# Patient Record
Sex: Female | Born: 1995 | Race: Black or African American | Hispanic: No | Marital: Single | Smoking: Never smoker
Health system: Southern US, Community
[De-identification: ages and names within clinical notes are randomized; demographics above are authoritative.]

## PROBLEM LIST (undated history)

## (undated) DIAGNOSIS — Z789 Other specified health status: Secondary | ICD-10-CM

## (undated) DIAGNOSIS — D649 Anemia, unspecified: Secondary | ICD-10-CM

## (undated) DIAGNOSIS — O139 Gestational [pregnancy-induced] hypertension without significant proteinuria, unspecified trimester: Secondary | ICD-10-CM

## (undated) HISTORY — PX: NO PAST SURGERIES: SHX2092

---
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
## 2020-07-23 NOTE — L&D Delivery Note (Addendum)
[facility] Delivery Note  Jenella NEESHA LANGTON is a 25 y.o. G1P0 s/p SVD at [redacted]w[redacted]d. She was admitted for IOL for preeclampsia with severe features.   ROM: 15h 55m with clear fluid GBS Status:  Negative/-- (12/15 1028) Maximum Maternal Temperature:   Labor Progress: Initial SVE: 1/40/-3. She then progressed to complete.   Delivery Date/Time:  Delivery: Called to room and patient was complete and pushing. Head delivered LOA. No nuchal cord present. Shoulder and body delivered in usual fashion. Infant with spontaneous cry, placed on mother's abdomen, dried and stimulated. Cord clamped x 2 due to low fetal tone. Cord blood drawn. Placenta delivered spontaneously with gentle cord traction. Fundus firm with massage and Pitocin. Labia, perineum, vagina, and cervix inspected inspected with no lacerations .  Baby Weight: pending  Placenta: Sent to L&D Complications: None Lacerations: none EBL: 350 mL Analgesia: Epidural   Infant:  APGAR (1 MIN):  6 APGAR (5 MINS):  8 APGAR (10 MINS):     Levie Heritage, DO [facility] 07/18/2021, 5:20 PM

## 2021-04-28 ENCOUNTER — Encounter (HOSPITAL_COMMUNITY): Payer: Self-pay

## 2021-04-28 ENCOUNTER — Inpatient Hospital Stay (HOSPITAL_COMMUNITY)
Admission: EM | Admit: 2021-04-28 | Discharge: 2021-04-28 | Disposition: A | Discharge status: Home / Self Care | Payer: 59 | Attending: Obstetrics and Gynecology | Admitting: Obstetrics and Gynecology

## 2021-04-28 DIAGNOSIS — O368120 Decreased fetal movements, second trimester, not applicable or unspecified: Secondary | ICD-10-CM | POA: Diagnosis present

## 2021-04-28 DIAGNOSIS — Z3A25 25 weeks gestation of pregnancy: Secondary | ICD-10-CM | POA: Diagnosis not present

## 2021-04-28 DIAGNOSIS — O26892 Other specified pregnancy related conditions, second trimester: Secondary | ICD-10-CM | POA: Diagnosis not present

## 2021-04-28 DIAGNOSIS — Z3689 Encounter for other specified antenatal screening: Secondary | ICD-10-CM

## 2021-04-28 DIAGNOSIS — R030 Elevated blood-pressure reading, without diagnosis of hypertension: Secondary | ICD-10-CM | POA: Insufficient documentation

## 2021-04-28 HISTORY — DX: Other specified health status: Z78.9

## 2021-04-28 LAB — CBC WITH DIFFERENTIAL/PLATELET
Abs Immature Granulocytes: 0.48 10*3/uL — ABNORMAL HIGH (ref 0.00–0.07)
Basophils Absolute: 0.1 10*3/uL (ref 0.0–0.1)
Basophils Relative: 0 %
Eosinophils Absolute: 0.2 10*3/uL (ref 0.0–0.5)
Eosinophils Relative: 1 %
HCT: 31.5 % — ABNORMAL LOW (ref 36.0–46.0)
Hemoglobin: 10 g/dL — ABNORMAL LOW (ref 12.0–15.0)
Immature Granulocytes: 3 %
Lymphocytes Relative: 13 %
Lymphs Abs: 2.3 10*3/uL (ref 0.7–4.0)
MCH: 26.9 pg (ref 26.0–34.0)
MCHC: 31.7 g/dL (ref 30.0–36.0)
MCV: 84.7 fL (ref 80.0–100.0)
Monocytes Absolute: 1.2 10*3/uL — ABNORMAL HIGH (ref 0.1–1.0)
Monocytes Relative: 7 %
Neutro Abs: 13.1 10*3/uL — ABNORMAL HIGH (ref 1.7–7.7)
Neutrophils Relative %: 76 %
Platelets: 337 10*3/uL (ref 150–400)
RBC: 3.72 MIL/uL — ABNORMAL LOW (ref 3.87–5.11)
RDW: 13.9 % (ref 11.5–15.5)
WBC: 17.4 10*3/uL — ABNORMAL HIGH (ref 4.0–10.5)
nRBC: 0 % (ref 0.0–0.2)

## 2021-04-28 LAB — COMPREHENSIVE METABOLIC PANEL
ALT: 14 U/L (ref 0–44)
AST: 15 U/L (ref 15–41)
Albumin: 2.7 g/dL — ABNORMAL LOW (ref 3.5–5.0)
Alkaline Phosphatase: 89 U/L (ref 38–126)
Anion gap: 7 (ref 5–15)
BUN: 5 mg/dL — ABNORMAL LOW (ref 6–20)
CO2: 23 mmol/L (ref 22–32)
Calcium: 8.6 mg/dL — ABNORMAL LOW (ref 8.9–10.3)
Chloride: 106 mmol/L (ref 98–111)
Creatinine, Ser: 0.52 mg/dL (ref 0.44–1.00)
GFR, Estimated: >60 mL/min (ref >60)
Glucose, Bld: 84 mg/dL (ref 70–99)
Potassium: 3.9 mmol/L (ref 3.5–5.1)
Sodium: 136 mmol/L (ref 135–145)
Total Bilirubin: 0.4 mg/dL (ref 0.3–1.2)
Total Protein: 6.4 g/dL — ABNORMAL LOW (ref 6.5–8.1)

## 2021-04-28 LAB — PROTEIN / CREATININE RATIO, URINE
Creatinine, Urine: 119.76 mg/dL
Protein Creatinine Ratio: 0.08 mg/mg{Cre} (ref 0.00–0.15)
Total Protein, Urine: 10 mg/dL

## 2021-04-28 LAB — POC URINE PREG, ED: Preg Test, Ur: POSITIVE — AB

## 2021-04-28 NOTE — MAU Note (Signed)
Pt transferred from [facility] with c/o no fetal movement since yesterday. Pt stated she hanot elt baby move since yesterday. Has not had any prenatal visits since she was 10 weeks due to insurance.  No pain, cramping or vag bleeding or discharge reported.

## 2021-04-28 NOTE — Discharge Instructions (Signed)

## 2021-04-28 NOTE — MAU Provider Note (Signed)
History     CSN: [id number]  Arrival date and time: 04/28/21 1310   None     Chief Complaint  Patient presents with   Decreased Fetal Movement   [name] is a 25 y.o. G1P0 at [redacted]w[redacted]d who presents to MAU for decreased fetal movement, but on arrival to MAU, patient states the fetal movement has returned to normal. However, patient was found to incidentally have elevated BP, without symptoms, so preeclampsia labs were drawn. Patient states has not been seen since 9 weeks of pregnancy because she was being seen at [facility] and they were out-of-network and each visit was costing her $500-$600 dollars, which she cannot afford.  Pt denies VB, LOF, ctx, decreased FM, vaginal discharge/odor/itching. Pt denies N/V, abdominal pain, constipation, diarrhea, or urinary problems. Pt denies fever, chills, fatigue, sweating or changes in appetite. Pt denies SOB or chest pain. Pt denies dizziness, HA, light-headedness, weakness.   OB History     Gravida  1   Para      Term      Preterm      AB      Living         SAB      IAB      Ectopic      Multiple      Live Births              Past Medical History:  Diagnosis Date   Medical history non-contributory     Past Surgical History:  Procedure Laterality Date   NO PAST SURGERIES      No family history on file.  Social History   Tobacco Use   Smoking status: Never   Smokeless tobacco: Never  Substance Use Topics   Alcohol use: No   Drug use: No    Allergies:  Allergies  Allergen Reactions   Kiwi Extract Anaphylaxis    No medications prior to admission.    Review of Systems  Constitutional:  Negative for chills, diaphoresis, fatigue and fever.  Eyes:  Negative for visual disturbance.  Respiratory:  Negative for shortness of breath.   Cardiovascular:  Negative for chest pain.  Gastrointestinal:  Negative for abdominal pain, constipation, diarrhea, nausea and vomiting.   Genitourinary:  Negative for dysuria, flank pain, frequency, pelvic pain, urgency, vaginal bleeding and vaginal discharge.  Neurological:  Negative for dizziness, weakness, light-headedness and headaches.   Physical Exam   Blood pressure 129/71, pulse 79, temperature 98.3 F (36.8 C), temperature source Oral, resp. rate 18, height 5\' 8"  (1.727 m), weight 104.3 kg, SpO2 100 %.  Patient Vitals for the past 24 hrs:  BP Temp Temp src Pulse Resp SpO2 Height Weight  04/28/21 2015 129/71 -- -- 79 -- -- -- --  04/28/21 2000 (!) 123/57 -- -- 84 -- 100 % -- --  04/28/21 1945 122/64 -- -- 86 -- 100 % -- --  04/28/21 1930 122/62 -- -- 86 -- 100 % -- --  04/28/21 1917 116/60 -- -- 86 -- 99 % -- --  04/28/21 1900 (!) 120/56 98.3 F (36.8 C) Oral 85 18 100 % -- --  04/28/21 1855 -- -- -- -- -- 100 % -- --  04/28/21 1850 -- -- -- -- -- 100 % -- --  04/28/21 1845 115/66 -- -- 86 -- 100 % -- --  04/28/21 1840 -- -- -- -- -- 100 % -- --  04/28/21 1835 -- -- -- -- -- 100 % -- --  04/28/21 1830 -- -- -- -- -- 99 % -- --  04/28/21 1825 -- -- -- -- -- 100 % -- --  04/28/21 1815 127/62 -- -- 81 -- 100 % -- --  04/28/21 1810 -- -- -- -- -- 100 % -- --  04/28/21 1805 -- -- -- -- -- 100 % -- --  04/28/21 1800 (!) 128/59 -- -- 81 -- 100 % -- --  04/28/21 1755 -- -- -- -- -- 100 % -- --  04/28/21 1750 -- -- -- -- -- 100 % -- --  04/28/21 1746 132/64 -- -- 88 -- -- -- --  04/28/21 1745 -- -- -- -- -- 100 % -- --  04/28/21 1740 -- -- -- -- -- 100 % -- --  04/28/21 1735 -- -- -- -- -- 100 % -- --  04/28/21 1731 (!) 141/74 -- -- 85 -- -- -- --  04/28/21 1730 -- -- -- -- -- 100 % -- --  04/28/21 1725 -- -- -- -- -- 100 % -- --  04/28/21 1720 -- -- -- -- -- 100 % -- --  04/28/21 1715 (!) 141/78 -- -- 97 -- 100 % -- --  04/28/21 1710 -- -- -- -- -- 100 % -- --  04/28/21 1705 -- -- -- -- -- 100 % -- --  04/28/21 1700 -- -- -- -- -- 99 % -- --  04/28/21 1655 -- -- -- -- -- 99 % -- --  04/28/21 1650 -- -- --  -- -- 100 % -- --  04/28/21 1645 -- -- -- -- -- 99 % -- --  04/28/21 1640 -- -- -- -- -- 99 % -- --  04/28/21 1635 -- -- -- -- -- 99 % -- --  04/28/21 1630 -- -- -- -- -- 99 % -- --  04/28/21 1625 -- -- -- -- -- 98 % -- --  04/28/21 1620 -- -- -- -- -- 99 % -- --  04/28/21 1615 -- -- -- -- -- 100 % -- --  04/28/21 1552 (!) 144/79 98.5 F (36.9 C) -- 79 18 -- 5\' 8"  (1.727 m) 104.3 kg  04/28/21 1451 (!) 149/75 98.5 F (36.9 C) Oral 93 16 100 % -- --   Physical Exam Vitals and nursing note reviewed.  Constitutional:      General: She is not in acute distress.    Appearance: Normal appearance. She is not ill-appearing, toxic-appearing or diaphoretic.  HENT:     Head: Normocephalic and atraumatic.  Pulmonary:     Effort: Pulmonary effort is normal.  Neurological:     Mental Status: She is alert and oriented to person, place, and time.  Psychiatric:        Mood and Affect: Mood normal.        Behavior: Behavior normal.        Thought Content: Thought content normal.        Judgment: Judgment normal.   Results for orders placed or performed during the hospital encounter of 04/28/21 (from the past 24 hour(s))  POC Urine Pregnancy, ED (not at [facility])     Status: Abnormal   Collection Time: 04/28/21  2:53 PM  Result Value Ref Range   Preg Test, Ur POSITIVE (A) NEGATIVE  CBC with Differential/Platelet     Status: Abnormal   Collection Time: 04/28/21  5:02 PM  Result Value Ref Range   WBC 17.4 (H) 4.0 - 10.5 K/uL   RBC 3.72 (L) 3.87 - 5.11 MIL/uL  Hemoglobin 10.0 (L) 12.0 - 15.0 g/dL   HCT 10.3 (L) 15.9 - 45.8 %   MCV 84.7 80.0 - 100.0 fL   MCH 26.9 26.0 - 34.0 pg   MCHC 31.7 30.0 - 36.0 g/dL   RDW 59.2 92.4 - 46.2 %   Platelets 337 150 - 400 K/uL   nRBC 0.0 0.0 - 0.2 %   Neutrophils Relative % 76 %   Neutro Abs 13.1 (H) 1.7 - 7.7 K/uL   Lymphocytes Relative 13 %   Lymphs Abs 2.3 0.7 - 4.0 K/uL   Monocytes Relative 7 %   Monocytes Absolute 1.2 (H) 0.1 - 1.0 K/uL   Eosinophils  Relative 1 %   Eosinophils Absolute 0.2 0.0 - 0.5 K/uL   Basophils Relative 0 %   Basophils Absolute 0.1 0.0 - 0.1 K/uL   Immature Granulocytes 3 %   Abs Immature Granulocytes 0.48 (H) 0.00 - 0.07 K/uL  Comprehensive metabolic panel     Status: Abnormal   Collection Time: 04/28/21  5:02 PM  Result Value Ref Range   Sodium 136 135 - 145 mmol/L   Potassium 3.9 3.5 - 5.1 mmol/L   Chloride 106 98 - 111 mmol/L   CO2 23 22 - 32 mmol/L   Glucose, Bld 84 70 - 99 mg/dL   BUN 5 (L) 6 - 20 mg/dL   Creatinine, Ser 8.63 0.44 - 1.00 mg/dL   Calcium 8.6 (L) 8.9 - 10.3 mg/dL   Total Protein 6.4 (L) 6.5 - 8.1 g/dL   Albumin 2.7 (L) 3.5 - 5.0 g/dL   AST 15 15 - 41 U/L   ALT 14 0 - 44 U/L   Alkaline Phosphatase 89 38 - 126 U/L   Total Bilirubin 0.4 0.3 - 1.2 mg/dL   GFR, Estimated >81 >77 mL/min   Anion gap 7 5 - 15  Protein / creatinine ratio, urine     Status: None   Collection Time: 04/28/21  6:27 PM  Result Value Ref Range   Creatinine, Urine 119.76 mg/dL   Total Protein, Urine 10 mg/dL   Protein Creatinine Ratio 0.08 0.00 - 0.15 mg/mg[Cre]   No results found.  MAU Course  Procedures  MDM -preeclampsia evaluation without severe range BP in MAU on admission -elevated BP of 140s/70s -symptoms include: none -CBC: H/H 10/31.5, platelets 337 -CMP: serum creatinine 0.52, AST/ALT 15/14 -PCr: 0.08 -EFM: reactive       -baseline: 145       -variability: moderate       -accels: present, 15x15       -decels: absent       -TOCO: quiet -period of sustained elevated HR, likely related to fetal movement, returned to baseline, good variability, no maternal fever, normal fetal movement -pt pushed button for fetal movement 41 times in 1 hour -message sent to [facility] to schedule for BP check this week and NOB visit ASAP -pt discharged to home in stable condition  Orders Placed This Encounter  Procedures   CBC with Differential/Platelet    Standing Status:   Standing    Number of  Occurrences:   1   Comprehensive metabolic panel    Standing Status:   Standing    Number of Occurrences:   1   Protein / creatinine ratio, urine    Standing Status:   Standing    Number of Occurrences:   1   POC Urine Pregnancy, ED (not at [facility])    Standing Status:  Standing    Number of Occurrences:   1   Discharge patient    Order Specific Question:   Discharge disposition    Answer:   01-Home or Self Care [1]    Order Specific Question:   Discharge patient date    Answer:   04/28/2021   No orders of the defined types were placed in this encounter.  Assessment and Plan   1. Decreased fetal movements in second trimester, single or unspecified fetus   2. Elevated blood pressure reading without diagnosis of hypertension   3. [redacted] weeks gestation of pregnancy   4. NST (non-stress test) reactive     Allergies as of 04/28/2021       Reactions   Kiwi Extract Anaphylaxis        Medication List     STOP taking these medications    ibuprofen 600 MG tablet Commonly known as: ADVIL       TAKE these medications    multivitamin-prenatal 27-0.8 MG Tabs tablet Take 1 tablet by mouth daily at 12 noon.   ondansetron 4 MG disintegrating tablet Commonly known as: Zofran ODT Take 1 tablet (4 mg total) by mouth every 8 (eight) hours as needed for nausea.   TYLENOL PO Take 1 tablet by mouth once. Tylenol       -Reviewed warning blood pressure values (systolic = / > 140 and/or diastolic =/> 90). Explained that, if blood pressure is elevated, she should sit down, rest, and eat/drink something. If still elevated 15 minutes later, and she is greater than 20 weeks, she should call clinic or come to MAU. She should come to MAU if she has elevated pressures and any of the following:  -headache not relieved with tylenol, rest, hydration -blurry vision, floating spots in her vision -sudden full-body edema or facial edema -RUQ pain that is constant. -chest pain or shortness of  breath -new onset or sudden worsening of nausea and vomiting These symptoms may indicate that her blood pressure is worsening and she may be developing gestational hypertension or pre-eclampsia, which is an emergency.  -return MAU precautions given -pt discharged to home in stable condition  Odie Sera Maysen Bonsignore 04/28/2021, 8:29 PM

## 2021-04-28 NOTE — ED Provider Notes (Signed)
Emergency Medicine Provider OB Triage Evaluation Note  Cindy Garner is a 25 y.o. female, No obstetric history on file., at Unknown gestation who presents to the emergency department with complaints of lack of fetal movement.  Review of  Systems  Positive: decreased fetal movement Negative: abd pain, vaginal bleeding, vaginal discharge  Physical Exam  BP (!) 149/75   Pulse 93   Temp 98.5 F (36.9 C) (Oral)   Resp 16   SpO2 100%  General: Awake, no distress  HEENT: Atraumatic  Resp: Normal effort  Cardiac: Normal rate Abd: Nondistended, nontender  MSK: Moves all extremities without difficulty Neuro: Speech clear  Medical Decision Making  Pt evaluated for pregnancy concern and is stable for transfer to MAU. Pt is in agreement with plan for transfer.  3:06 PM Discussed with MAU APP, Victorino Dike, who accepts patient in transfer.     Fayrene Helper, PA-C 04/28/21 1507    Ernie Avena, MD 04/28/21 [phone]

## 2021-04-28 NOTE — ED Notes (Signed)
Report was called to [facility]  transport will come take her over

## 2021-05-09 ENCOUNTER — Encounter: Payer: Self-pay | Admitting: General Practice

## 2021-05-09 ENCOUNTER — Encounter: Payer: Self-pay | Admitting: Advanced Practice Midwife

## 2021-05-09 ENCOUNTER — Other Ambulatory Visit: Payer: Self-pay

## 2021-05-09 ENCOUNTER — Ambulatory Visit (INDEPENDENT_AMBULATORY_CARE_PROVIDER_SITE_OTHER): Payer: 59 | Admitting: Advanced Practice Midwife

## 2021-05-09 VITALS — BP 122/79 | HR 84 | Wt 235.0 lb

## 2021-05-09 DIAGNOSIS — Z3400 Encounter for supervision of normal first pregnancy, unspecified trimester: Secondary | ICD-10-CM

## 2021-05-09 DIAGNOSIS — O139 Gestational [pregnancy-induced] hypertension without significant proteinuria, unspecified trimester: Secondary | ICD-10-CM | POA: Diagnosis not present

## 2021-05-09 DIAGNOSIS — Z3A27 27 weeks gestation of pregnancy: Secondary | ICD-10-CM | POA: Diagnosis not present

## 2021-05-09 DIAGNOSIS — Z23 Encounter for immunization: Secondary | ICD-10-CM

## 2021-05-09 NOTE — Progress Notes (Signed)
INITIAL OBSTETRICAL VISIT Patient name: Cindy Garner MRN [id number]  Date of birth: 1995-08-10 Chief Complaint:   Initial Prenatal Visit  History of Present Illness:   Cindy Garner is a 25 y.o. G1P0  female at [redacted]w[redacted]d by LMP and early [place]. with an Estimated Date of Delivery: 08/07/21 being seen today for her initial obstetrical visit.   Had one New OB visit at [facility] but insurance did not cover them.  Has not had any visits since 9 wks.  Her obstetrical history is significant for  Late to care .   Today she reports no complaints.  Depression screen PHQ 2/9 05/09/2021  Decreased Interest 0  Down, Depressed, Hopeless 0  PHQ - 2 Score 0  Altered sleeping 0  Tired, decreased energy 0  Change in appetite 0  Feeling bad or failure about yourself  0  Trouble concentrating 0  Moving slowly or fidgety/restless 0  Suicidal thoughts 0  PHQ-9 Score 0    Patient's last menstrual period was 11/09/2020. Last pap not sure   Agrees to do one PP (they did not do one at other office) Review of Systems:   Pertinent items are noted in HPI Denies cramping/contractions, leakage of fluid, vaginal bleeding, abnormal vaginal discharge w/ itching/odor/irritation, headaches, visual changes, shortness of breath, chest pain, abdominal pain, severe nausea/vomiting, or problems with urination or bowel movements unless otherwise stated above.  Pertinent History Reviewed:  Reviewed past medical,surgical, social, obstetrical and family history.  Reviewed problem list, medications and allergies. OB History  Gravida Para Term Preterm AB Living  1            SAB IAB Ectopic Multiple Live Births               # Outcome Date GA Lbr Len/2nd Weight Sex Delivery Anes PTL Lv  1 Current            Physical Assessment:   Vitals:   05/09/21 1025  BP: 122/79  Pulse: 84  Weight: 235 lb (106.6 kg)  Body mass index is 35.73 kg/m.       Physical Examination:  General appearance - well appearing, and in no  distress  Mental status - alert, oriented to person, place, and time  Psych:  She has a normal mood and affect  Skin - warm and dry, normal color, no suspicious lesions noted  Chest - effort normal, all lung fields clear to auscultation bilaterally  Heart - normal rate and regular rhythm  Abdomen - soft, nontender  Extremities:  No swelling or varicosities noted  Pelvic - VULVA: normal appearing vulva with no masses, tenderness or lesions  VAGINA: normal appearing vagina with normal color and discharge, no lesions  CERVIX: normal appearing cervix without discharge or lesions, no CMT  Thin prep pap is not done   Vitals:   05/09/21 1025  BP: 122/79  Pulse: 84     Assessment & Plan:  1) High-Risk Pregnancy G1P0 at [redacted]w[redacted]d with an Estimated Date of Delivery: 08/07/21   2) Initial OB visit      TDAP today  3)  Single episode of hypertension at 25wks (normal at 27wk) with normal labs      Watch for [facility]   Initial labs obtained Continue prenatal vitamins Reviewed n/v relief measures and warning s/s to report Reviewed recommended weight gain based on pre-gravid BMI Encouraged well-balanced diet Genetic & carrier screening discussed: requests Panorama, requests Panorama  Had a "gender only" blood test at an  [place] place Ultrasound discussed; fetal survey: requested CCNC completed> form faxed if has or is planning to apply for medicaid The nature of [facility] [employer] with multiple MDs and other Advanced Practice Providers was explained to patient; also emphasized that fellows, residents, and students are part of our team.   Indications for ASA therapy (per uptodate)    Too late to start  Follow-up: 2 wks                    Anatomy US at MFM this week                     May need twice weekly testing if she has any more elevated BPs   Orders Placed This Encounter  Procedures   Culture, OB Urine   [place] MFM OB DETAIL +14 WK   Tdap vaccine greater than or equal to  7yo IM    Wynelle Bourgeois CNM, [facility] 05/09/2021 12:58 PM

## 2021-05-10 ENCOUNTER — Other Ambulatory Visit: Payer: 59

## 2021-05-10 DIAGNOSIS — Z3400 Encounter for supervision of normal first pregnancy, unspecified trimester: Secondary | ICD-10-CM

## 2021-05-10 DIAGNOSIS — Z3A27 27 weeks gestation of pregnancy: Secondary | ICD-10-CM

## 2021-05-10 NOTE — Progress Notes (Signed)
Patient sent to lab for 27 weeks. Armandina Stammer RN

## 2021-05-11 ENCOUNTER — Other Ambulatory Visit: Payer: Self-pay

## 2021-05-11 ENCOUNTER — Ambulatory Visit: Payer: 59 | Admitting: *Deleted

## 2021-05-11 ENCOUNTER — Encounter: Payer: Self-pay | Admitting: *Deleted

## 2021-05-11 ENCOUNTER — Ambulatory Visit: Payer: 59 | Attending: Advanced Practice Midwife

## 2021-05-11 ENCOUNTER — Other Ambulatory Visit: Payer: Self-pay | Admitting: *Deleted

## 2021-05-11 VITALS — BP 128/63 | HR 90

## 2021-05-11 DIAGNOSIS — Z363 Encounter for antenatal screening for malformations: Secondary | ICD-10-CM | POA: Diagnosis not present

## 2021-05-11 DIAGNOSIS — Z3689 Encounter for other specified antenatal screening: Secondary | ICD-10-CM

## 2021-05-11 DIAGNOSIS — O368990 Maternal care for other specified fetal problems, unspecified trimester, not applicable or unspecified: Secondary | ICD-10-CM

## 2021-05-11 DIAGNOSIS — O0933 Supervision of pregnancy with insufficient antenatal care, third trimester: Secondary | ICD-10-CM | POA: Insufficient documentation

## 2021-05-11 DIAGNOSIS — O26893 Other specified pregnancy related conditions, third trimester: Secondary | ICD-10-CM | POA: Insufficient documentation

## 2021-05-11 DIAGNOSIS — O3663X0 Maternal care for excessive fetal growth, third trimester, not applicable or unspecified: Secondary | ICD-10-CM | POA: Diagnosis not present

## 2021-05-11 DIAGNOSIS — Z3A27 27 weeks gestation of pregnancy: Secondary | ICD-10-CM

## 2021-05-11 DIAGNOSIS — O0932 Supervision of pregnancy with insufficient antenatal care, second trimester: Secondary | ICD-10-CM

## 2021-05-11 DIAGNOSIS — O132 Gestational [pregnancy-induced] hypertension without significant proteinuria, second trimester: Secondary | ICD-10-CM | POA: Diagnosis present

## 2021-05-11 DIAGNOSIS — O139 Gestational [pregnancy-induced] hypertension without significant proteinuria, unspecified trimester: Secondary | ICD-10-CM

## 2021-05-11 DIAGNOSIS — O3662X0 Maternal care for excessive fetal growth, second trimester, not applicable or unspecified: Secondary | ICD-10-CM | POA: Diagnosis not present

## 2021-05-11 DIAGNOSIS — O26892 Other specified pregnancy related conditions, second trimester: Secondary | ICD-10-CM | POA: Diagnosis not present

## 2021-05-11 DIAGNOSIS — Z3400 Encounter for supervision of normal first pregnancy, unspecified trimester: Secondary | ICD-10-CM

## 2021-05-11 LAB — HEPATITIS C ANTIBODY: Hep C Virus Ab: <0.1 s/co ratio (ref 0.0–0.9)

## 2021-05-11 LAB — CBC/D/PLT+RPR+RH+ABO+RUBIGG...
Antibody Screen: NEGATIVE
Basophils Absolute: 0.1 10*3/uL (ref 0.0–0.2)
Basos: 0 %
EOS (ABSOLUTE): 0.2 10*3/uL (ref 0.0–0.4)
Eos: 2 %
HCV Ab: <0.1 s/co ratio (ref 0.0–0.9)
HIV Screen 4th Generation wRfx: NONREACTIVE
Hematocrit: 30.9 % — ABNORMAL LOW (ref 34.0–46.6)
Hemoglobin: 10.1 g/dL — ABNORMAL LOW (ref 11.1–15.9)
Hepatitis B Surface Ag: NEGATIVE
Immature Grans (Abs): 0.4 10*3/uL — ABNORMAL HIGH (ref 0.0–0.1)
Immature Granulocytes: 3 %
Lymphocytes Absolute: 2.1 10*3/uL (ref 0.7–3.1)
Lymphs: 13 %
MCH: 26.6 pg (ref 26.6–33.0)
MCHC: 32.7 g/dL (ref 31.5–35.7)
MCV: 82 fL (ref 79–97)
Monocytes Absolute: 1.2 10*3/uL — ABNORMAL HIGH (ref 0.1–0.9)
Monocytes: 8 %
Neutrophils Absolute: 11.6 10*3/uL — ABNORMAL HIGH (ref 1.4–7.0)
Neutrophils: 74 %
Platelets: 376 10*3/uL (ref 150–450)
RBC: 3.79 x10E6/uL (ref 3.77–5.28)
RDW: 13.3 % (ref 11.7–15.4)
RPR Ser Ql: NONREACTIVE
Rh Factor: POSITIVE
Rubella Antibodies, IGG: 2.63 index (ref >0.99)
WBC: 15.5 10*3/uL — ABNORMAL HIGH (ref 3.4–10.8)

## 2021-05-11 LAB — GLUCOSE TOLERANCE, 2 HOURS W/ 1HR
Glucose, 1 hour: 86 mg/dL (ref 70–179)
Glucose, 2 hour: 104 mg/dL (ref 70–152)
Glucose, Fasting: 80 mg/dL (ref 70–91)

## 2021-05-11 LAB — HCV INTERPRETATION

## 2021-05-12 ENCOUNTER — Other Ambulatory Visit: Payer: Self-pay

## 2021-05-12 LAB — CULTURE, OB URINE

## 2021-05-12 LAB — URINE CULTURE, OB REFLEX

## 2021-05-12 MED ORDER — CONCEPT DHA 53.5-38-1 MG PO CAPS: 1.0000 | ORAL_CAPSULE | Freq: Every day | ORAL | 12 refills | Status: AC

## 2021-05-23 ENCOUNTER — Other Ambulatory Visit: Payer: Self-pay

## 2021-05-23 ENCOUNTER — Ambulatory Visit (INDEPENDENT_AMBULATORY_CARE_PROVIDER_SITE_OTHER): Payer: 59 | Admitting: Advanced Practice Midwife

## 2021-05-23 ENCOUNTER — Encounter: Payer: Self-pay | Admitting: Advanced Practice Midwife

## 2021-05-23 VITALS — BP 133/61 | HR 93 | Wt 237.0 lb

## 2021-05-23 DIAGNOSIS — O283 Abnormal ultrasonic finding on antenatal screening of mother: Secondary | ICD-10-CM

## 2021-05-23 DIAGNOSIS — Z3A29 29 weeks gestation of pregnancy: Secondary | ICD-10-CM

## 2021-05-23 DIAGNOSIS — Z3400 Encounter for supervision of normal first pregnancy, unspecified trimester: Secondary | ICD-10-CM

## 2021-05-23 DIAGNOSIS — R030 Elevated blood-pressure reading, without diagnosis of hypertension: Secondary | ICD-10-CM

## 2021-05-23 MED ORDER — PANTOPRAZOLE SODIUM 20 MG PO TBEC: 20.0000 mg | DELAYED_RELEASE_TABLET | Freq: Every day | ORAL | 2 refills | Status: AC

## 2021-05-23 NOTE — Progress Notes (Signed)
   PRENATAL VISIT NOTE  Subjective:  Cindy Garner is a 25 y.o. G1P0 at [redacted]w[redacted]d being seen today for ongoing prenatal care.  She is currently monitored for the following issues for this low-risk pregnancy and has Supervision of normal first pregnancy, antepartum; Stress fracture of hip; and Abnormal fetal ultrasound on their problem list.  Patient reports  frequent acid reflux.  Takes Rolaids but wants something else for it .  Contractions: Not present. Vag. Bleeding: None.  Movement: Present. Denies leaking of fluid.   The following portions of the patient's history were reviewed and updated as appropriate: allergies, current medications, past family history, past medical history, past social history, past surgical history and problem list.   Objective:   Vitals:   05/23/21 0818  BP: 133/61  Pulse: 93  Weight: 237 lb (107.5 kg)    Fetal Status: Fetal Heart Rate (bpm): 145   Movement: Present     General:  Alert, oriented and cooperative. Patient is in no acute distress.  Skin: Skin is warm and dry. No rash noted.   Cardiovascular: Normal heart rate noted  Respiratory: Normal respiratory effort, no problems with respiration noted  Abdomen: Soft, gravid, appropriate for gestational age.  Pain/Pressure: Absent     Pelvic: Cervical exam deferred        Extremities: Normal range of motion.  Edema: None  Mental Status: Normal mood and affect. Normal behavior. Normal judgment and thought content.   Assessment and Plan:  Pregnancy: G1P0 at [redacted]w[redacted]d 1. Supervision of normal first pregnancy, antepartum      Glucola normal   2. [redacted] weeks gestation of pregnancy   3. Abnormal fetal ultrasound     LGA and minimal pericardial effusion     Followup scheduled by Dr Judeth Cornfield  4. Elevated blood pressure reading without diagnosis of hypertension     Normotensive today, will watch  Preterm labor symptoms and general obstetric precautions including but not limited to vaginal bleeding,  contractions, leaking of fluid and fetal movement were reviewed in detail with the patient. Please refer to After Visit Summary for other counseling recommendations.   Return in about 2 weeks (around 06/06/2021) for [employer].  Future Appointments  Date Time Provider Department Center  06/08/2021 10:55 AM Levie Heritage, DO [facility] None  06/14/2021  9:15 AM [facility] NURSE [facility]  06/14/2021  9:30 AM [facility] US3 [facility]  06/22/2021 10:55 AM Stinson, Rhona Raider, DO [facility] None    Wynelle Bourgeois, CNM

## 2021-06-08 ENCOUNTER — Other Ambulatory Visit: Payer: Self-pay

## 2021-06-08 ENCOUNTER — Ambulatory Visit (INDEPENDENT_AMBULATORY_CARE_PROVIDER_SITE_OTHER): Payer: 59 | Admitting: Family Medicine

## 2021-06-08 VITALS — BP 130/64 | HR 92 | Wt 247.0 lb

## 2021-06-08 DIAGNOSIS — Z3A31 31 weeks gestation of pregnancy: Secondary | ICD-10-CM

## 2021-06-08 DIAGNOSIS — Z3400 Encounter for supervision of normal first pregnancy, unspecified trimester: Secondary | ICD-10-CM

## 2021-06-08 DIAGNOSIS — O283 Abnormal ultrasonic finding on antenatal screening of mother: Secondary | ICD-10-CM

## 2021-06-08 NOTE — Progress Notes (Signed)
   PRENATAL VISIT NOTE  Subjective:  Cindy Garner is a 25 y.o. G1P0 at [redacted]w[redacted]d being seen today for ongoing prenatal care.  She is currently monitored for the following issues for this low-risk pregnancy and has Supervision of normal first pregnancy, antepartum; Stress fracture of hip; and Abnormal fetal ultrasound on their problem list.  Patient reports no complaints.  Contractions: Irritability. Vag. Bleeding: None.  Movement: Present. Denies leaking of fluid.   The following portions of the patient's history were reviewed and updated as appropriate: allergies, current medications, past family history, past medical history, past social history, past surgical history and problem list.   Objective:   Vitals:   06/08/21 1047  BP: 130/64  Pulse: 92  Weight: 247 lb (112 kg)    Fetal Status: Fetal Heart Rate (bpm): 150   Movement: Present     General:  Alert, oriented and cooperative. Patient is in no acute distress.  Skin: Skin is warm and dry. No rash noted.   Cardiovascular: Normal heart rate noted  Respiratory: Normal respiratory effort, no problems with respiration noted  Abdomen: Soft, gravid, appropriate for gestational age.  Pain/Pressure: Absent     Pelvic: Cervical exam deferred        Extremities: Normal range of motion.  Edema: None  Mental Status: Normal mood and affect. Normal behavior. Normal judgment and thought content.   Assessment and Plan:  Pregnancy: G1P0 at [redacted]w[redacted]d 1. Supervision of normal first pregnancy, antepartum  2. [redacted] weeks gestation of pregnancy  3. Abnormal fetal ultrasound Had pericardial effusion on last [place]. Follow up US next week.  Preterm labor symptoms and general obstetric precautions including but not limited to vaginal bleeding, contractions, leaking of fluid and fetal movement were reviewed in detail with the patient. Please refer to After Visit Summary for other counseling recommendations.   No follow-ups on file.  Future Appointments   Date Time Provider Department Center  06/14/2021  9:15 AM [facility] NURSE [facility]  06/14/2021  9:30 AM [facility] US3 [facility]  06/22/2021 10:55 AM Levie Heritage, DO [facility] None  07/06/2021  9:15 AM Levie Heritage, DO [facility] None  07/14/2021  9:35 AM Levie Heritage, DO [facility] None  07/21/2021 11:15 AM Levie Heritage, DO [facility] None  07/28/2021 11:15 AM Levie Heritage, DO [facility] None  08/04/2021 11:15 AM Milas Hock, MD [facility] None    Levie Heritage, DO

## 2021-06-14 ENCOUNTER — Encounter: Payer: Self-pay | Admitting: *Deleted

## 2021-06-14 ENCOUNTER — Ambulatory Visit: Payer: 59 | Admitting: *Deleted

## 2021-06-14 ENCOUNTER — Ambulatory Visit: Payer: 59 | Attending: Obstetrics and Gynecology

## 2021-06-14 ENCOUNTER — Other Ambulatory Visit: Payer: Self-pay

## 2021-06-14 VITALS — BP 136/69 | HR 98

## 2021-06-14 DIAGNOSIS — O3663X0 Maternal care for excessive fetal growth, third trimester, not applicable or unspecified: Secondary | ICD-10-CM | POA: Diagnosis not present

## 2021-06-14 DIAGNOSIS — Z3A32 32 weeks gestation of pregnancy: Secondary | ICD-10-CM | POA: Diagnosis not present

## 2021-06-14 DIAGNOSIS — O133 Gestational [pregnancy-induced] hypertension without significant proteinuria, third trimester: Secondary | ICD-10-CM | POA: Insufficient documentation

## 2021-06-14 DIAGNOSIS — O0933 Supervision of pregnancy with insufficient antenatal care, third trimester: Secondary | ICD-10-CM | POA: Diagnosis not present

## 2021-06-14 DIAGNOSIS — Z363 Encounter for antenatal screening for malformations: Secondary | ICD-10-CM | POA: Diagnosis not present

## 2021-06-14 DIAGNOSIS — Z3400 Encounter for supervision of normal first pregnancy, unspecified trimester: Secondary | ICD-10-CM | POA: Diagnosis present

## 2021-06-14 DIAGNOSIS — O368930 Maternal care for other specified fetal problems, third trimester, not applicable or unspecified: Secondary | ICD-10-CM

## 2021-06-14 DIAGNOSIS — O0932 Supervision of pregnancy with insufficient antenatal care, second trimester: Secondary | ICD-10-CM

## 2021-06-14 DIAGNOSIS — O368990 Maternal care for other specified fetal problems, unspecified trimester, not applicable or unspecified: Secondary | ICD-10-CM

## 2021-06-22 ENCOUNTER — Other Ambulatory Visit: Payer: Self-pay

## 2021-06-22 ENCOUNTER — Encounter: Payer: Self-pay | Admitting: General Practice

## 2021-06-22 ENCOUNTER — Ambulatory Visit (INDEPENDENT_AMBULATORY_CARE_PROVIDER_SITE_OTHER): Payer: 59 | Admitting: Family Medicine

## 2021-06-22 VITALS — BP 140/76 | HR 102 | Wt 251.0 lb

## 2021-06-22 DIAGNOSIS — Z3400 Encounter for supervision of normal first pregnancy, unspecified trimester: Secondary | ICD-10-CM

## 2021-06-22 DIAGNOSIS — O283 Abnormal ultrasonic finding on antenatal screening of mother: Secondary | ICD-10-CM

## 2021-06-22 DIAGNOSIS — O133 Gestational [pregnancy-induced] hypertension without significant proteinuria, third trimester: Secondary | ICD-10-CM

## 2021-06-22 NOTE — Progress Notes (Signed)
   PRENATAL VISIT NOTE  Subjective:  Cindy Garner is a 25 y.o. G1P0 at [redacted]w[redacted]d being seen today for ongoing prenatal care.  She is currently monitored for the following issues for this high-risk pregnancy and has Supervision of normal first pregnancy, antepartum; Stress fracture of hip; Abnormal fetal ultrasound; and Gestational (pregnancy-induced) hypertension without significant proteinuria, third trimester on their problem list.  Patient reports no complaints.  Contractions: Not present. Vag. Bleeding: None.  Movement: Present. Denies leaking of fluid.   The following portions of the patient's history were reviewed and updated as appropriate: allergies, current medications, past family history, past medical history, past social history, past surgical history and problem list.   Objective:   Vitals:   06/22/21 1030  BP: 140/76  Pulse: (!) 102  Weight: 251 lb (113.9 kg)    Fetal Status: Fetal Heart Rate (bpm): 147   Movement: Present     General:  Alert, oriented and cooperative. Patient is in no acute distress.  Skin: Skin is warm and dry. No rash noted.   Cardiovascular: Normal heart rate noted  Respiratory: Normal respiratory effort, no problems with respiration noted  Abdomen: Soft, gravid, appropriate for gestational age.  Pain/Pressure: Absent     Pelvic: Cervical exam deferred        Extremities: Normal range of motion.  Edema: None  Mental Status: Normal mood and affect. Normal behavior. Normal judgment and thought content.   Assessment and Plan:  Pregnancy: G1P0 at [redacted]w[redacted]d 1. Supervision of normal first pregnancy, antepartum FHT and FH normal  2. Abnormal fetal ultrasound Normal - no pericardial effusion on last US  3. Gestational (pregnancy-induced) hypertension without significant proteinuria, third trimester BP elevated at MFM and today.  No elevated BP prior to pregnancy.  Check labs.  Will need induction at 37 weeks. - Protein / creatinine ratio, urine -  Comp Met (CMET) - CBC  Preterm labor symptoms and general obstetric precautions including but not limited to vaginal bleeding, contractions, leaking of fluid and fetal movement were reviewed in detail with the patient. Please refer to After Visit Summary for other counseling recommendations.   No follow-ups on file.  Future Appointments  Date Time Provider Department Center  07/06/2021  9:15 AM Stinson, Jacob J, DO [facility] None  07/14/2021  9:35 AM Stinson, Jacob J, DO [facility] None  07/21/2021 11:15 AM Stinson, Jacob J, DO [facility] None  07/28/2021 11:15 AM Stinson, Jacob J, DO [facility] None  08/04/2021 11:15 AM Duncan, Paula, MD [facility] None    Jacob J Stinson, DO  

## 2021-06-23 ENCOUNTER — Other Ambulatory Visit: Payer: Self-pay | Admitting: Family Medicine

## 2021-06-23 DIAGNOSIS — D508 Other iron deficiency anemias: Secondary | ICD-10-CM

## 2021-06-23 LAB — COMPREHENSIVE METABOLIC PANEL
ALT: 13 IU/L (ref 0–32)
AST: 19 IU/L (ref 0–40)
Albumin/Globulin Ratio: 1.3 (ref 1.2–2.2)
Albumin: 3.5 g/dL — ABNORMAL LOW (ref 3.9–5.0)
Alkaline Phosphatase: 189 IU/L — ABNORMAL HIGH (ref 44–121)
BUN/Creatinine Ratio: 7 — ABNORMAL LOW (ref 9–23)
BUN: 4 mg/dL — ABNORMAL LOW (ref 6–20)
Bilirubin Total: 0.3 mg/dL (ref 0.0–1.2)
CO2: 19 mmol/L — ABNORMAL LOW (ref 20–29)
Calcium: 9 mg/dL (ref 8.7–10.2)
Chloride: 104 mmol/L (ref 96–106)
Creatinine, Ser: 0.57 mg/dL (ref 0.57–1.00)
Globulin, Total: 2.6 g/dL (ref 1.5–4.5)
Glucose: 107 mg/dL — ABNORMAL HIGH (ref 70–99)
Potassium: 4.4 mmol/L (ref 3.5–5.2)
Sodium: 137 mmol/L (ref 134–144)
Total Protein: 6.1 g/dL (ref 6.0–8.5)
eGFR: 132 mL/min/{1.73_m2} (ref >59)

## 2021-06-23 LAB — CBC
Hematocrit: 30.9 % — ABNORMAL LOW (ref 34.0–46.6)
Hemoglobin: 9.5 g/dL — ABNORMAL LOW (ref 11.1–15.9)
MCH: 23.8 pg — ABNORMAL LOW (ref 26.6–33.0)
MCHC: 30.7 g/dL — ABNORMAL LOW (ref 31.5–35.7)
MCV: 77 fL — ABNORMAL LOW (ref 79–97)
Platelets: 447 10*3/uL (ref 150–450)
RBC: 3.99 x10E6/uL (ref 3.77–5.28)
RDW: 14 % (ref 11.7–15.4)
WBC: 15.3 10*3/uL — ABNORMAL HIGH (ref 3.4–10.8)

## 2021-06-23 LAB — PROTEIN / CREATININE RATIO, URINE
Creatinine, Urine: 120.1 mg/dL
Protein, Ur: 33 mg/dL
Protein/Creat Ratio: 275 mg/g creat — ABNORMAL HIGH (ref 0–200)

## 2021-07-06 ENCOUNTER — Other Ambulatory Visit: Payer: Self-pay

## 2021-07-06 ENCOUNTER — Ambulatory Visit (INDEPENDENT_AMBULATORY_CARE_PROVIDER_SITE_OTHER): Payer: 59 | Admitting: Family Medicine

## 2021-07-06 ENCOUNTER — Other Ambulatory Visit (HOSPITAL_COMMUNITY)
Admission: RE | Admit: 2021-07-06 | Discharge: 2021-07-06 | Disposition: A | Discharge status: Home / Self Care | Payer: 59 | Source: Ambulatory Visit | Attending: Family Medicine | Admitting: Family Medicine

## 2021-07-06 VITALS — BP 137/77 | HR 102 | Wt 256.0 lb

## 2021-07-06 DIAGNOSIS — O133 Gestational [pregnancy-induced] hypertension without significant proteinuria, third trimester: Secondary | ICD-10-CM | POA: Insufficient documentation

## 2021-07-06 DIAGNOSIS — Z3400 Encounter for supervision of normal first pregnancy, unspecified trimester: Secondary | ICD-10-CM

## 2021-07-06 DIAGNOSIS — Z3A35 35 weeks gestation of pregnancy: Secondary | ICD-10-CM | POA: Diagnosis not present

## 2021-07-06 LAB — OB RESULTS CONSOLE GC/CHLAMYDIA: Gonorrhea: NEGATIVE

## 2021-07-06 NOTE — Progress Notes (Signed)
° °  PRENATAL VISIT NOTE  Subjective:  Cindy Garner is a 25 y.o. G1P0 at [redacted]w[redacted]d being seen today for ongoing prenatal care.  She is currently monitored for the following issues for this high-risk pregnancy and has Supervision of normal first pregnancy, antepartum; Stress fracture of hip; Abnormal fetal ultrasound; and Gestational (pregnancy-induced) hypertension without significant proteinuria, third trimester on their problem list.  Patient reports no complaints.  Contractions: Irritability. Vag. Bleeding: None.  Movement: Present. Denies leaking of fluid.   The following portions of the patient's history were reviewed and updated as appropriate: allergies, current medications, past family history, past medical history, past social history, past surgical history and problem list.   Objective:   Vitals:   07/06/21 0904  BP: 137/77  Pulse: (!) 102  Weight: 256 lb (116.1 kg)    Fetal Status: Fetal Heart Rate (bpm): 145   Movement: Present     General:  Alert, oriented and cooperative. Patient is in no acute distress.  Skin: Skin is warm and dry. No rash noted.   Cardiovascular: Normal heart rate noted  Respiratory: Normal respiratory effort, no problems with respiration noted  Abdomen: Soft, gravid, appropriate for gestational age.  Pain/Pressure: Present     Pelvic: Cervical exam deferred        Extremities: Normal range of motion.  Edema: None  Mental Status: Normal mood and affect. Normal behavior. Normal judgment and thought content.   Assessment and Plan:  Pregnancy: G1P0 at [redacted]w[redacted]d 1. [redacted] weeks gestation of pregnancy - GC/Chlamydia probe amp ([place])not at [facility] - Culture, beta strep (group b only)  2. Supervision of normal first pregnancy, antepartum FHT and FH normal  3. Gestational (pregnancy-induced) hypertension without significant proteinuria, third trimester BP okay today. Still qualifies for [facility] and needs induction at 37 weeks. - GC/Chlamydia probe amp ([facility])not at [facility] - Culture, beta strep (group b only) - [place] MFM FETAL BPP WO NON STRESS; Future  Preterm labor symptoms and general obstetric precautions including but not limited to vaginal bleeding, contractions, leaking of fluid and fetal movement were reviewed in detail with the patient. Please refer to After Visit Summary for other counseling recommendations.   No follow-ups on file.  Future Appointments  Date Time Provider Department Center  07/07/2021  8:00 AM [facility]-RM3 [facility] None  07/12/2021 12:30 PM [facility] NURSE [facility]  07/12/2021 12:45 PM [facility] US6 [facility]  07/14/2021  9:35 AM Adrian Blackwater, Rhona Raider, DO [facility] None  07/17/2021  7:15 AM MC-LD SCHED ROOM [facility]DC None  07/21/2021 11:15 AM Levie Heritage, DO [facility] None    Levie Heritage, DO

## 2021-07-07 ENCOUNTER — Encounter (HOSPITAL_COMMUNITY)
Admission: RE | Admit: 2021-07-07 | Discharge: 2021-07-07 | Disposition: A | Discharge status: Home / Self Care | Payer: 59 | Source: Ambulatory Visit | Attending: Family Medicine | Admitting: Family Medicine

## 2021-07-07 DIAGNOSIS — D509 Iron deficiency anemia, unspecified: Secondary | ICD-10-CM | POA: Diagnosis not present

## 2021-07-07 DIAGNOSIS — Z3A00 Weeks of gestation of pregnancy not specified: Secondary | ICD-10-CM | POA: Diagnosis not present

## 2021-07-07 DIAGNOSIS — O99019 Anemia complicating pregnancy, unspecified trimester: Secondary | ICD-10-CM | POA: Diagnosis not present

## 2021-07-07 LAB — GC/CHLAMYDIA PROBE AMP (~~LOC~~) NOT AT ARMC
Chlamydia: NEGATIVE
Comment: NEGATIVE
Comment: NORMAL
Neisseria Gonorrhea: NEGATIVE

## 2021-07-07 MED ORDER — METHYLPREDNISOLONE SODIUM SUCC 125 MG IJ SOLR: 125.0000 mg | Freq: Once | INTRAMUSCULAR | Status: DC | PRN

## 2021-07-07 MED ORDER — SODIUM CHLORIDE 0.9 % IV BOLUS: 500.0000 mL | Freq: Once | INTRAVENOUS | Status: DC | PRN

## 2021-07-07 MED ORDER — DIPHENHYDRAMINE HCL 50 MG/ML IJ SOLN: 25.0000 mg | Freq: Once | INTRAMUSCULAR | Status: DC | PRN

## 2021-07-07 MED ORDER — EPINEPHRINE PF 1 MG/ML IJ SOLN: 0.3000 mg | Freq: Once | INTRAMUSCULAR | Status: DC | PRN

## 2021-07-07 MED ORDER — SODIUM CHLORIDE 0.9 % IV SOLN
300.0000 mg | INTRAVENOUS | Status: DC
  Administered 2021-07-07: 300 mg via INTRAVENOUS
  Filled 2021-07-07: qty 300

## 2021-07-07 MED ORDER — ALBUTEROL SULFATE (2.5 MG/3ML) 0.083% IN NEBU: 2.5000 mg | INHALATION_SOLUTION | Freq: Once | RESPIRATORY_TRACT | Status: DC | PRN

## 2021-07-07 MED ORDER — SODIUM CHLORIDE 0.9 % IV SOLN: INTRAVENOUS | Status: DC | PRN

## 2021-07-10 LAB — CULTURE, BETA STREP (GROUP B ONLY): Strep Gp B Culture: NEGATIVE

## 2021-07-12 ENCOUNTER — Ambulatory Visit: Payer: 59 | Admitting: *Deleted

## 2021-07-12 ENCOUNTER — Ambulatory Visit: Payer: 59 | Attending: Family Medicine

## 2021-07-12 ENCOUNTER — Other Ambulatory Visit: Payer: Self-pay | Admitting: Advanced Practice Midwife

## 2021-07-12 ENCOUNTER — Other Ambulatory Visit: Payer: Self-pay

## 2021-07-12 VITALS — BP 134/74 | HR 89

## 2021-07-12 DIAGNOSIS — Z362 Encounter for other antenatal screening follow-up: Secondary | ICD-10-CM | POA: Insufficient documentation

## 2021-07-12 DIAGNOSIS — O368930 Maternal care for other specified fetal problems, third trimester, not applicable or unspecified: Secondary | ICD-10-CM | POA: Insufficient documentation

## 2021-07-12 DIAGNOSIS — Z3A36 36 weeks gestation of pregnancy: Secondary | ICD-10-CM | POA: Diagnosis not present

## 2021-07-12 DIAGNOSIS — Z3400 Encounter for supervision of normal first pregnancy, unspecified trimester: Secondary | ICD-10-CM

## 2021-07-12 DIAGNOSIS — O133 Gestational [pregnancy-induced] hypertension without significant proteinuria, third trimester: Secondary | ICD-10-CM | POA: Diagnosis present

## 2021-07-12 DIAGNOSIS — O0933 Supervision of pregnancy with insufficient antenatal care, third trimester: Secondary | ICD-10-CM

## 2021-07-12 DIAGNOSIS — O3663X0 Maternal care for excessive fetal growth, third trimester, not applicable or unspecified: Secondary | ICD-10-CM | POA: Insufficient documentation

## 2021-07-12 DIAGNOSIS — O1493 Unspecified pre-eclampsia, third trimester: Secondary | ICD-10-CM | POA: Diagnosis not present

## 2021-07-13 ENCOUNTER — Encounter (HOSPITAL_COMMUNITY)
Admission: RE | Admit: 2021-07-13 | Discharge: 2021-07-13 | Disposition: A | Discharge status: Home / Self Care | Payer: 59 | Source: Ambulatory Visit | Attending: Family Medicine | Admitting: Family Medicine

## 2021-07-13 DIAGNOSIS — O99019 Anemia complicating pregnancy, unspecified trimester: Secondary | ICD-10-CM | POA: Diagnosis not present

## 2021-07-13 MED ORDER — SODIUM CHLORIDE 0.9 % IV SOLN
300.0000 mg | INTRAVENOUS | Status: DC
  Administered 2021-07-13: 09:00:00 300 mg via INTRAVENOUS
  Filled 2021-07-13: qty 300

## 2021-07-14 ENCOUNTER — Ambulatory Visit (INDEPENDENT_AMBULATORY_CARE_PROVIDER_SITE_OTHER): Payer: 59 | Admitting: Family Medicine

## 2021-07-14 ENCOUNTER — Other Ambulatory Visit: Payer: Self-pay

## 2021-07-14 VITALS — BP 131/75 | HR 93 | Wt 259.0 lb

## 2021-07-14 DIAGNOSIS — Z3A36 36 weeks gestation of pregnancy: Secondary | ICD-10-CM | POA: Diagnosis not present

## 2021-07-14 DIAGNOSIS — O133 Gestational [pregnancy-induced] hypertension without significant proteinuria, third trimester: Secondary | ICD-10-CM | POA: Diagnosis not present

## 2021-07-14 DIAGNOSIS — Z3400 Encounter for supervision of normal first pregnancy, unspecified trimester: Secondary | ICD-10-CM

## 2021-07-14 NOTE — Progress Notes (Signed)
° °  PRENATAL VISIT NOTE  Subjective:  Cindy Garner is a 25 y.o. G1P0 at [redacted]w[redacted]d being seen today for ongoing prenatal care.  She is currently monitored for the following issues for this high-risk pregnancy and has Supervision of normal first pregnancy, antepartum; Stress fracture of hip; Abnormal fetal ultrasound; and Gestational (pregnancy-induced) hypertension without significant proteinuria, third trimester on their problem list.  Patient reports no complaints.  Contractions: Not present. Vag. Bleeding: None.  Movement: Present. Denies leaking of fluid.   The following portions of the patient's history were reviewed and updated as appropriate: allergies, current medications, past family history, past medical history, past social history, past surgical history and problem list.   Objective:   Vitals:   07/14/21 0934  BP: 131/75  Pulse: 93  Weight: 259 lb (117.5 kg)    Fetal Status: Fetal Heart Rate (bpm): 130   Movement: Present     General:  Alert, oriented and cooperative. Patient is in no acute distress.  Skin: Skin is warm and dry. No rash noted.   Cardiovascular: Normal heart rate noted  Respiratory: Normal respiratory effort, no problems with respiration noted  Abdomen: Soft, gravid, appropriate for gestational age.  Pain/Pressure: Absent     Pelvic: Cervical exam deferred        Extremities: Normal range of motion.  Edema: None  Mental Status: Normal mood and affect. Normal behavior. Normal judgment and thought content.   Assessment and Plan:  Pregnancy: G1P0 at [redacted]w[redacted]d 1. [redacted] weeks gestation of pregnancy  2. Supervision of normal first pregnancy, antepartum FHT and FH normal  3. Gestational (pregnancy-induced) hypertension without significant proteinuria, third trimester No severe symptoms. Scheduled for 37 weeks.  Preterm labor symptoms and general obstetric precautions including but not limited to vaginal bleeding, contractions, leaking of fluid and fetal movement  were reviewed in detail with the patient. Please refer to After Visit Summary for other counseling recommendations.   No follow-ups on file.  Future Appointments  Date Time Provider Department Center  07/17/2021  7:15 AM MC-LD SCHED ROOM [facility] None  07/20/2021  9:00 AM MC[facility]-RM9 [facility] None  07/21/2021 11:15 AM Levie Heritage, DO [facility] None    Levie Heritage, DO

## 2021-07-17 ENCOUNTER — Encounter (HOSPITAL_COMMUNITY): Payer: Self-pay | Admitting: Family Medicine

## 2021-07-17 ENCOUNTER — Inpatient Hospital Stay (HOSPITAL_COMMUNITY)
Admission: AD | Admit: 2021-07-17 | Discharge: 2021-07-21 | DRG: 807 | Disposition: A | Discharge status: Home / Self Care | Payer: 59 | Attending: Family Medicine | Admitting: Family Medicine

## 2021-07-17 ENCOUNTER — Other Ambulatory Visit: Payer: Self-pay

## 2021-07-17 ENCOUNTER — Inpatient Hospital Stay (HOSPITAL_COMMUNITY): Payer: 59

## 2021-07-17 DIAGNOSIS — O1410 Severe pre-eclampsia, unspecified trimester: Secondary | ICD-10-CM | POA: Diagnosis not present

## 2021-07-17 DIAGNOSIS — Z20822 Contact with and (suspected) exposure to covid-19: Secondary | ICD-10-CM | POA: Diagnosis present

## 2021-07-17 DIAGNOSIS — O1414 Severe pre-eclampsia complicating childbirth: Secondary | ICD-10-CM | POA: Diagnosis present

## 2021-07-17 DIAGNOSIS — Z3A37 37 weeks gestation of pregnancy: Secondary | ICD-10-CM | POA: Diagnosis not present

## 2021-07-17 DIAGNOSIS — O134 Gestational [pregnancy-induced] hypertension without significant proteinuria, complicating childbirth: Secondary | ICD-10-CM | POA: Diagnosis present

## 2021-07-17 DIAGNOSIS — Z3490 Encounter for supervision of normal pregnancy, unspecified, unspecified trimester: Secondary | ICD-10-CM | POA: Diagnosis present

## 2021-07-17 DIAGNOSIS — Z3400 Encounter for supervision of normal first pregnancy, unspecified trimester: Secondary | ICD-10-CM

## 2021-07-17 HISTORY — DX: Anemia, unspecified: D64.9

## 2021-07-17 HISTORY — DX: Gestational (pregnancy-induced) hypertension without significant proteinuria, unspecified trimester: O13.9

## 2021-07-17 LAB — CBC
HCT: 32.9 % — ABNORMAL LOW (ref 36.0–46.0)
HCT: 33.6 % — ABNORMAL LOW (ref 36.0–46.0)
Hemoglobin: 9.8 g/dL — ABNORMAL LOW (ref 12.0–15.0)
Hemoglobin: 10 g/dL — ABNORMAL LOW (ref 12.0–15.0)
MCH: 24.8 pg — ABNORMAL LOW (ref 26.0–34.0)
MCH: 24.8 pg — ABNORMAL LOW (ref 26.0–34.0)
MCHC: 29.8 g/dL — ABNORMAL LOW (ref 30.0–36.0)
MCHC: 29.8 g/dL — ABNORMAL LOW (ref 30.0–36.0)
MCV: 83.3 fL (ref 80.0–100.0)
MCV: 83.4 fL (ref 80.0–100.0)
Platelets: 356 10*3/uL (ref 150–400)
Platelets: 313 10*3/uL (ref 150–400)
RBC: 3.95 MIL/uL (ref 3.87–5.11)
RBC: 4.03 MIL/uL (ref 3.87–5.11)
RDW: 20.5 % — ABNORMAL HIGH (ref 11.5–15.5)
RDW: 21.1 % — ABNORMAL HIGH (ref 11.5–15.5)
WBC: 16.1 10*3/uL — ABNORMAL HIGH (ref 4.0–10.5)
WBC: 19.8 10*3/uL — ABNORMAL HIGH (ref 4.0–10.5)
nRBC: 0.2 % (ref 0.0–0.2)
nRBC: 0.1 % (ref 0.0–0.2)

## 2021-07-17 LAB — TYPE AND SCREEN
ABO/RH(D): O POS
Antibody Screen: NEGATIVE

## 2021-07-17 LAB — RESP PANEL BY RT-PCR (FLU A&B, COVID) ARPGX2
Influenza A by PCR: NEGATIVE
Influenza B by PCR: NEGATIVE
SARS Coronavirus 2 by RT PCR: NEGATIVE

## 2021-07-17 LAB — COMPREHENSIVE METABOLIC PANEL
ALT: 10 U/L (ref 0–44)
AST: 36 U/L (ref 15–41)
Albumin: 2.6 g/dL — ABNORMAL LOW (ref 3.5–5.0)
Alkaline Phosphatase: 157 U/L — ABNORMAL HIGH (ref 38–126)
Anion gap: 8 (ref 5–15)
BUN: 10 mg/dL (ref 6–20)
CO2: 19 mmol/L — ABNORMAL LOW (ref 22–32)
Calcium: 8.5 mg/dL — ABNORMAL LOW (ref 8.9–10.3)
Chloride: 107 mmol/L (ref 98–111)
Creatinine, Ser: 0.59 mg/dL (ref 0.44–1.00)
GFR, Estimated: >60 mL/min (ref >60)
Glucose, Bld: 81 mg/dL (ref 70–99)
Potassium: 4.8 mmol/L (ref 3.5–5.1)
Sodium: 134 mmol/L — ABNORMAL LOW (ref 135–145)
Total Bilirubin: 1.1 mg/dL (ref 0.3–1.2)
Total Protein: 6.1 g/dL — ABNORMAL LOW (ref 6.5–8.1)

## 2021-07-17 LAB — PROTEIN / CREATININE RATIO, URINE
Creatinine, Urine: 52.93 mg/dL
Protein Creatinine Ratio: 0.3 mg/mg{Cre} — ABNORMAL HIGH (ref 0.00–0.15)
Total Protein, Urine: 16 mg/dL

## 2021-07-17 MED ORDER — FENTANYL-BUPIVACAINE-NACL 0.5-0.125-0.9 MG/250ML-% EP SOLN
12.0000 mL/h | EPIDURAL | Status: DC | PRN
  Administered 2021-07-18 (×2): 12 mL/h via EPIDURAL
  Filled 2021-07-17 (×2): qty 250

## 2021-07-17 MED ORDER — ONDANSETRON HCL 4 MG/2ML IJ SOLN
4.0000 mg | Freq: Four times a day (QID) | INTRAMUSCULAR | Status: DC | PRN
  Administered 2021-07-17 – 2021-07-18 (×2): 4 mg via INTRAVENOUS
  Filled 2021-07-17 (×2): qty 2

## 2021-07-17 MED ORDER — MISOPROSTOL 25 MCG QUARTER TABLET: 25.0000 ug | ORAL_TABLET | ORAL | Status: DC | PRN

## 2021-07-17 MED ORDER — MISOPROSTOL 50MCG HALF TABLET
50.0000 ug | ORAL_TABLET | ORAL | Status: DC | PRN
  Administered 2021-07-17: 13:00:00 50 ug via BUCCAL
  Filled 2021-07-17: qty 1

## 2021-07-17 MED ORDER — DIPHENHYDRAMINE HCL 50 MG/ML IJ SOLN: 12.5000 mg | INTRAMUSCULAR | Status: DC | PRN

## 2021-07-17 MED ORDER — EPHEDRINE 5 MG/ML INJ: 10.0000 mg | INTRAVENOUS | Status: DC | PRN

## 2021-07-17 MED ORDER — TERBUTALINE SULFATE 1 MG/ML IJ SOLN: 0.2500 mg | Freq: Once | INTRAMUSCULAR | Status: DC | PRN

## 2021-07-17 MED ORDER — PHENYLEPHRINE 40 MCG/ML (10ML) SYRINGE FOR IV PUSH (FOR BLOOD PRESSURE SUPPORT): 80.0000 ug | PREFILLED_SYRINGE | INTRAVENOUS | Status: DC | PRN

## 2021-07-17 MED ORDER — OXYTOCIN-SODIUM CHLORIDE 30-0.9 UT/500ML-% IV SOLN
1.0000 m[IU]/min | INTRAVENOUS | Status: DC
Start: 2021-07-17 — End: 2021-07-18
  Administered 2021-07-17: 17:00:00 2 m[IU]/min via INTRAVENOUS

## 2021-07-17 MED ORDER — ACETAMINOPHEN 325 MG PO TABS
650.0000 mg | ORAL_TABLET | ORAL | Status: DC | PRN
  Administered 2021-07-17 – 2021-07-18 (×2): 650 mg via ORAL
  Filled 2021-07-17 (×2): qty 2

## 2021-07-17 MED ORDER — SOD CITRATE-CITRIC ACID 500-334 MG/5ML PO SOLN: 30.0000 mL | ORAL | Status: DC | PRN

## 2021-07-17 MED ORDER — LACTATED RINGERS IV SOLN
500.0000 mL | Freq: Once | INTRAVENOUS | Status: AC
  Administered 2021-07-17: 500 mL via INTRAVENOUS

## 2021-07-17 MED ORDER — LACTATED RINGERS IV SOLN: 500.0000 mL | INTRAVENOUS | Status: DC | PRN

## 2021-07-17 MED ORDER — OXYTOCIN BOLUS FROM INFUSION
333.0000 mL | Freq: Once | INTRAVENOUS | Status: AC
  Administered 2021-07-18: 17:00:00 333 mL via INTRAVENOUS

## 2021-07-17 MED ORDER — LACTATED RINGERS IV SOLN: INTRAVENOUS | Status: DC

## 2021-07-17 MED ORDER — OXYCODONE-ACETAMINOPHEN 5-325 MG PO TABS: 1.0000 | ORAL_TABLET | ORAL | Status: DC | PRN

## 2021-07-17 MED ORDER — OXYCODONE-ACETAMINOPHEN 5-325 MG PO TABS: 2.0000 | ORAL_TABLET | ORAL | Status: DC | PRN

## 2021-07-17 MED ORDER — OXYTOCIN-SODIUM CHLORIDE 30-0.9 UT/500ML-% IV SOLN
2.5000 [IU]/h | INTRAVENOUS | Status: DC
  Administered 2021-07-18: 18:00:00 2.5 [IU]/h via INTRAVENOUS
  Filled 2021-07-17 (×2): qty 500

## 2021-07-17 MED ORDER — LIDOCAINE HCL (PF) 1 % IJ SOLN: 30.0000 mL | INTRAMUSCULAR | Status: DC | PRN

## 2021-07-17 MED ORDER — LEVONORGESTREL 20.1 MCG/DAY IU IUD: 1.0000 | INTRAUTERINE_SYSTEM | Freq: Once | INTRAUTERINE | Status: DC

## 2021-07-17 NOTE — H&P (Addendum)
OBSTETRIC ADMISSION HISTORY AND PHYSICAL  Cindy Garner is a 25 y.o. female G1P0 with IUP at [redacted]w[redacted]d presenting for IOL for gHTN. She reports +FMs. No LOF, VB, blurry vision, headaches, peripheral edema, or RUQ pain. She plans on formula feeding. She requests IUD for birth control.  Dating: By [employer] Sign[employer] [place] --->  Estimated Date of Delivery: 08/07/21  Sono:    @[redacted]w[redacted]d , normal anatomy, ceph presentation, 2974g, 61%ile, EFW 6'9  Prenatal History/Complications: -fetal pericardial effusion, resolved -gHTN -late prenatal care  Past Medical History: Past Medical History:  Diagnosis Date   Anemia    Medical history non-contributory    Pregnancy induced hypertension     Past Surgical History: Past Surgical History:  Procedure Laterality Date   NO PAST SURGERIES      Obstetrical History: OB History     Gravida  1   Para      Term      Preterm      AB      Living         SAB      IAB      Ectopic      Multiple      Live Births              Social History: Social History   Socioeconomic History   Marital status: Single    Spouse name: Not on file   Number of children: Not on file   Years of education: Not on file   Highest education level: Not on file  Occupational History   Not on file  Tobacco Use   Smoking status: Never   Smokeless tobacco: Never  Vaping Use   Vaping Use: Never used  Substance and Sexual Activity   Alcohol use: No   Drug use: No   Sexual activity: Yes  Other Topics Concern   Not on file  Social History Narrative   Not on file   Social Determinants of Health   Financial Resource Strain: Not on file  Food Insecurity: Not on file  Transportation Needs: Not on file  Physical Activity: Not on file  Stress: Not on file  Social Connections: Not on file    Family History: Family History  Problem Relation Age of Onset   Cancer Neg Hx    Diabetes Neg Hx    Heart disease Neg Hx    Hypertension Neg Hx      Allergies: Allergies  Allergen Reactions   Kiwi Extract Anaphylaxis and Other (See Comments)    Throat itchy    Medications Prior to Admission  Medication Sig Dispense Refill Last Dose   pantoprazole (PROTONIX) 20 MG tablet Take 1 tablet (20 mg total) by mouth daily. 30 tablet 2 07/16/2021   Prenatal Vit-Fe Fumarate-FA (MULTIVITAMIN-PRENATAL) 27-0.8 MG TABS tablet Take 1 tablet by mouth daily at 12 noon. OTC PNV   07/16/2021     Review of Systems:  All systems reviewed and negative except as stated in HPI  PE: Blood pressure (!) 147/94, pulse 87, last menstrual period 11/09/2020. General appearance: alert, cooperative, and no distress Lungs: regular rate and effort Heart: regular rate  Abdomen: soft, non-tender Extremities: Homans sign is negative, no sign of DVT Presentation: cephalic EFM: 135 bpm, mod variability, + accels, no decels Toco: irreg SVE: 1/40/-3, vtx  Prenatal labs: ABO, Rh: --/--/O POS (12/26 1105) Antibody: NEG (12/26 1105) Rubella: 2.63 (10/19 0844) RPR: Non Reactive (10/19 0844)  HBsAg: Negative (10/19 0844)  HIV: Non Reactive (  10/19 0844)  GBS: Negative/-- (12/15 1028)  2 hr GTT nml  Prenatal Transfer Tool  Maternal Diabetes: No Genetic Screening: Declined Maternal Ultrasounds/Referrals: Other: Fetal Ultrasounds or other Referrals:  Referred to Materal Fetal Medicine  Maternal Substance Abuse:  No Significant Maternal Medications:  None Significant Maternal Lab Results: Group B Strep negative  Results for orders placed or performed during the hospital encounter of 07/17/21 (from the past 24 hour(s))  CBC   Collection Time: 07/17/21 11:05 AM  Result Value Ref Range   WBC 16.1 (H) 4.0 - 10.5 K/uL   RBC 3.95 3.87 - 5.11 MIL/uL   Hemoglobin 9.8 (L) 12.0 - 15.0 g/dL   HCT 32.9 (L) 36.0 - 46.0 %   MCV 83.3 80.0 - 100.0 fL   MCH 24.8 (L) 26.0 - 34.0 pg   MCHC 29.8 (L) 30.0 - 36.0 g/dL   RDW 20.5 (H) 11.5 - 15.5 %   Platelets 356 150 -  400 K/uL   nRBC 0.2 0.0 - 0.2 %  Type and screen   Collection Time: 07/17/21 11:05 AM  Result Value Ref Range   ABO/RH(D) O POS    Antibody Screen NEG    Sample Expiration      07/20/2021,2359 Performed at [facility], [address], [place], [place] [address]     Patient Active Problem List   Diagnosis Date Noted   Encounter for induction of labor 07/17/2021   Gestational (pregnancy-induced) hypertension without significant proteinuria, third trimester 06/22/2021   Abnormal fetal ultrasound 05/23/2021   Supervision of normal first pregnancy, antepartum 05/09/2021   Stress fracture of hip 05/06/2014    Assessment: Cindy Garner is a 25 y.o. G1P0 at [redacted]w[redacted]d here for IOL for gHTN  1. Labor: latent 2. FWB: Cat I 3. Pain: analgesia/anesthesia prn 4. GBS: neg   Plan: Admit to LD Cervical ripening PEC labs Anticipate SVD  Julianne Handler, CNM  07/17/2021, 12:07 PM

## 2021-07-17 NOTE — Progress Notes (Signed)
No severe range BPs. Denies HA, vision changes, RUQ pain. Ctx are very mild per pt.  Cx 4/60/-2. FHR Cat 1.  Ctx q 2-4 minutes.  Pitocin at 14 mu/min.  Continue present mgt.

## 2021-07-17 NOTE — Progress Notes (Signed)
Labor Progress Note Cindy Garner is a 25 y.o. G1P0 at [redacted]w[redacted]d presented for IOL for gHTN  S:  Comfortable, no c/o.  O:  BP (!) 147/94    Pulse 87    LMP 11/09/2020  EFM: baseline 135 bpm/ mod variability/ + accels/ no decels  Toco/IUPC: irreg SVE: Dilation: 1 Effacement (%): 40 Station: -3 Presentation: Vertex Exam by:: [name], CNM   A/P: 25 y.o. G1P0 110w0d  1. Labor: latent 2. FWB: Cat I 3. Pain: analgesia/anesthesia/NO prn 4. gHTN: stable  Consent for FB, placed w/o difficulty. Start Cytotec. Pitocin when appropriate. Anticipate SVD.  [name], CNM 12:06 PM

## 2021-07-17 NOTE — Progress Notes (Signed)
Labor Progress Note Cindy Garner is a 25 y.o. G1P0 at [redacted]w[redacted]d presented for IOL for gHTN now with PCR 0.3 so PEC  S:  Comfortable, no c/o. Foley fell out a few hrs ago.   O:  BP 137/86    Pulse 78    Temp 98.2 F (36.8 C) (Oral)    Resp 16    LMP 11/09/2020  EFM: baseline 125 bpm/ mod variability/ + accels/ no decels  Toco/IUPC: 2-4 SVE: Dilation: 4 Effacement (%): 60 Station: -3 Presentation: Vertex Exam by:: [name], CNM  A/P: 25 y.o. G1P0 [redacted]w[redacted]d  1. Labor: latent 2. FWB: Cat I 3. Pain: analgesia/anesthesia/NO prn 4. PEC: stable  Recommend Pitocin, pt agrees. Anticipate labor progress and SVD.  [name], CNM 4:53 PM

## 2021-07-18 ENCOUNTER — Inpatient Hospital Stay (HOSPITAL_COMMUNITY): Payer: 59 | Admitting: Anesthesiology

## 2021-07-18 ENCOUNTER — Encounter (HOSPITAL_COMMUNITY): Payer: Self-pay | Admitting: Family Medicine

## 2021-07-18 DIAGNOSIS — Z3A37 37 weeks gestation of pregnancy: Secondary | ICD-10-CM

## 2021-07-18 DIAGNOSIS — O1414 Severe pre-eclampsia complicating childbirth: Secondary | ICD-10-CM

## 2021-07-18 LAB — RPR: RPR Ser Ql: NONREACTIVE

## 2021-07-18 MED ORDER — COCONUT OIL OIL: 1.0000 "application " | TOPICAL_OIL | Status: DC | PRN

## 2021-07-18 MED ORDER — TRANEXAMIC ACID-NACL 1000-0.7 MG/100ML-% IV SOLN: 1000.0000 mg | INTRAVENOUS | Status: AC

## 2021-07-18 MED ORDER — LIDOCAINE HCL (PF) 1 % IJ SOLN
INTRAMUSCULAR | Status: DC | PRN
  Administered 2021-07-18: 11 mL via EPIDURAL

## 2021-07-18 MED ORDER — LABETALOL HCL 5 MG/ML IV SOLN: 40.0000 mg | INTRAVENOUS | Status: DC | PRN

## 2021-07-18 MED ORDER — ENOXAPARIN SODIUM 60 MG/0.6ML IJ SOSY
60.0000 mg | PREFILLED_SYRINGE | INTRAMUSCULAR | Status: DC
  Administered 2021-07-19 – 2021-07-20 (×2): 60 mg via SUBCUTANEOUS
  Filled 2021-07-18 (×2): qty 0.6

## 2021-07-18 MED ORDER — PANTOPRAZOLE SODIUM 20 MG PO TBEC
20.0000 mg | DELAYED_RELEASE_TABLET | Freq: Every day | ORAL | Status: DC
  Administered 2021-07-18 – 2021-07-20 (×3): 20 mg via ORAL
  Filled 2021-07-18 (×3): qty 1

## 2021-07-18 MED ORDER — OXYCODONE HCL 5 MG PO TABS: 5.0000 mg | ORAL_TABLET | ORAL | Status: DC | PRN

## 2021-07-18 MED ORDER — MAGNESIUM SULFATE 40 GM/1000ML IV SOLN
2.0000 g/h | INTRAVENOUS | Status: AC
  Administered 2021-07-18 (×2): 2 g/h via INTRAVENOUS
  Filled 2021-07-18 (×2): qty 1000

## 2021-07-18 MED ORDER — IBUPROFEN 600 MG PO TABS
600.0000 mg | ORAL_TABLET | Freq: Four times a day (QID) | ORAL | Status: DC
  Administered 2021-07-18 – 2021-07-21 (×11): 600 mg via ORAL
  Filled 2021-07-18 (×11): qty 1

## 2021-07-18 MED ORDER — TETANUS-DIPHTH-ACELL PERTUSSIS 5-2.5-18.5 LF-MCG/0.5 IM SUSY: 0.5000 mL | PREFILLED_SYRINGE | Freq: Once | INTRAMUSCULAR | Status: DC

## 2021-07-18 MED ORDER — ACETAMINOPHEN 325 MG PO TABS
650.0000 mg | ORAL_TABLET | ORAL | Status: DC | PRN
  Administered 2021-07-20 – 2021-07-21 (×3): 650 mg via ORAL
  Filled 2021-07-18 (×3): qty 2

## 2021-07-18 MED ORDER — SENNOSIDES-DOCUSATE SODIUM 8.6-50 MG PO TABS
2.0000 | ORAL_TABLET | Freq: Every day | ORAL | Status: DC
  Administered 2021-07-19 – 2021-07-20 (×2): 2 via ORAL
  Filled 2021-07-18 (×2): qty 2

## 2021-07-18 MED ORDER — HYDRALAZINE HCL 20 MG/ML IJ SOLN: 10.0000 mg | INTRAMUSCULAR | Status: DC | PRN

## 2021-07-18 MED ORDER — PRENATAL MULTIVITAMIN CH
1.0000 | ORAL_TABLET | Freq: Every day | ORAL | Status: DC
  Administered 2021-07-19 – 2021-07-20 (×2): 1 via ORAL
  Filled 2021-07-18 (×2): qty 1

## 2021-07-18 MED ORDER — MAGNESIUM SULFATE BOLUS VIA INFUSION
4.0000 g | Freq: Once | INTRAVENOUS | Status: AC
  Administered 2021-07-18: 02:00:00 4 g via INTRAVENOUS
  Filled 2021-07-18: qty 1000

## 2021-07-18 MED ORDER — WITCH HAZEL-GLYCERIN EX PADS: 1.0000 "application " | MEDICATED_PAD | CUTANEOUS | Status: DC | PRN

## 2021-07-18 MED ORDER — ZOLPIDEM TARTRATE 5 MG PO TABS: 5.0000 mg | ORAL_TABLET | Freq: Every evening | ORAL | Status: DC | PRN

## 2021-07-18 MED ORDER — ONDANSETRON HCL 4 MG/2ML IJ SOLN: 4.0000 mg | INTRAMUSCULAR | Status: DC | PRN

## 2021-07-18 MED ORDER — DIBUCAINE (PERIANAL) 1 % EX OINT: 1.0000 "application " | TOPICAL_OINTMENT | CUTANEOUS | Status: DC | PRN

## 2021-07-18 MED ORDER — LABETALOL HCL 200 MG PO TABS
200.0000 mg | ORAL_TABLET | Freq: Two times a day (BID) | ORAL | Status: DC
  Administered 2021-07-18: 05:00:00 200 mg via ORAL
  Filled 2021-07-18: qty 1

## 2021-07-18 MED ORDER — SIMETHICONE 80 MG PO CHEW: 80.0000 mg | CHEWABLE_TABLET | ORAL | Status: DC | PRN

## 2021-07-18 MED ORDER — LABETALOL HCL 5 MG/ML IV SOLN
20.0000 mg | INTRAVENOUS | Status: DC | PRN
  Administered 2021-07-18: 03:00:00 20 mg via INTRAVENOUS
  Filled 2021-07-18: qty 4

## 2021-07-18 MED ORDER — BENZOCAINE-MENTHOL 20-0.5 % EX AERO: 1.0000 "application " | INHALATION_SPRAY | CUTANEOUS | Status: DC | PRN

## 2021-07-18 MED ORDER — DIPHENHYDRAMINE HCL 25 MG PO CAPS: 25.0000 mg | ORAL_CAPSULE | Freq: Four times a day (QID) | ORAL | Status: DC | PRN

## 2021-07-18 MED ORDER — OXYCODONE HCL 5 MG PO TABS: 10.0000 mg | ORAL_TABLET | ORAL | Status: DC | PRN

## 2021-07-18 MED ORDER — LABETALOL HCL 5 MG/ML IV SOLN: 80.0000 mg | INTRAVENOUS | Status: DC | PRN

## 2021-07-18 MED ORDER — TRANEXAMIC ACID-NACL 1000-0.7 MG/100ML-% IV SOLN
INTRAVENOUS | Status: AC
  Administered 2021-07-18: 17:00:00 1000 mg via INTRAVENOUS
  Filled 2021-07-18: qty 100

## 2021-07-18 MED ORDER — ONDANSETRON HCL 4 MG PO TABS: 4.0000 mg | ORAL_TABLET | ORAL | Status: DC | PRN

## 2021-07-18 NOTE — Progress Notes (Signed)
Patient Vitals for the past 4 hrs:  BP Temp Temp src Pulse Resp SpO2  07/18/21 0734 130/68 -- -- 87 -- 97 %  07/18/21 0731 130/68 -- -- 87 14 --  07/18/21 0700 126/70 -- -- 88 16 94 %  07/18/21 0630 (!) 106/45 -- -- 85 16 --  07/18/21 0600 127/67 -- -- 97 16 --  07/18/21 0530 129/75 97.8 F (36.6 C) Oral (!) 101 -- --  07/18/21 0500 (!) 146/81 -- -- 95 16 --  07/18/21 0430 (!) 147/86 -- -- 86 16 --  07/18/21 0400 127/82 -- -- (!) 105 16 --   Comfortable w/epidural.  Had a bradycardic episode about an hour ago, pitocin was dc'd. FHR Cat 1 for nearly an hour now, ctx spaced out. Cx 8.5/90/-2.  Will restart pit at 6 mu/min. Mg+ at 2gm/hr

## 2021-07-18 NOTE — Discharge Summary (Signed)
Postpartum Discharge Summary       Patient Name: Cindy Garner DOB: 1995/10/27 MRN: [id number]  Date of admission: 07/17/2021 Delivery date:07/18/2021  Delivering provider: Truett Mainland  Date of discharge: 07/21/2021  Admitting diagnosis: Encounter for induction of labor [Z34.90] Intrauterine pregnancy: [redacted]w[redacted]d    Secondary diagnosis:  Principal Problem:   Encounter for induction of labor Active Problems:   Severe pre-eclampsia  Additional problems: Preeclampsia with severe features    Discharge diagnosis: Term Pregnancy Delivered and Preeclampsia (severe)                                              Post partum procedures: none Augmentation: AROM, Pitocin, Cytotec, and IP Foley Complications: None  Hospital course: Induction of Labor With Vaginal Delivery   25y.o. yo G1P0 at 341w1das admitted to the hospital 07/17/2021 for induction of labor.  Indication for induction: Preeclampsia.  Patient had an uncomplicated labor course as follows: Membrane Rupture Time/Date: 1:54 AM ,07/18/2021   Delivery Method:Vaginal, Spontaneous  Episiotomy: None  Lacerations:  None  Details of delivery can be found in separate delivery note.  Patient had a routine postpartum course. Patient is discharged home 07/21/21.  Newborn Data: Birth date:07/18/2021  Birth time:5:03 PM  Gender:Female  Living status:Living  Apgars:6 ,8  Weight:3070 g   Magnesium Sulfate received: Yes: Seizure prophylaxis BMZ received: No Rhophylac:N/A MMR:N/A T-DaP:Given postpartum Flu: No Transfusion:No  Physical exam  Vitals:   07/20/21 1429 07/20/21 2321 07/21/21 0510 07/21/21 0732  BP: 133/66 (!) 129/55 (!) 119/56 129/60  Pulse: 81 85 85 80  Resp: '18 18 18 18  ' Temp: 98.3 F (36.8 C) 98.4 F (36.9 C) 98.2 F (36.8 C) 98.3 F (36.8 C)  TempSrc: Oral Oral Oral Oral  SpO2: 100% 100% 100% 100%  Weight:      Height:       General: alert, cooperative, and no distress Lochia:  appropriate Uterine Fundus: firm Incision: N/A DVT Evaluation: No evidence of DVT seen on physical exam. Labs: Lab Results  Component Value Date   WBC 21.5 (H) 07/19/2021   HGB 9.0 (L) 07/19/2021   HCT 28.8 (L) 07/19/2021   MCV 82.1 07/19/2021   PLT 295 07/19/2021   CMP Latest Ref Rng & Units 07/17/2021  Glucose 70 - 99 mg/dL 81  BUN 6 - 20 mg/dL 10  Creatinine 0.44 - 1.00 mg/dL 0.59  Sodium 135 - 145 mmol/L 134(L)  Potassium 3.5 - 5.1 mmol/L 4.8  Chloride 98 - 111 mmol/L 107  CO2 22 - 32 mmol/L 19(L)  Calcium 8.9 - 10.3 mg/dL 8.5(L)  Total Protein 6.5 - 8.1 g/dL 6.1(L)  Total Bilirubin 0.3 - 1.2 mg/dL 1.1  Alkaline Phos 38 - 126 U/L 157(H)  AST 15 - 41 U/L 36  ALT 0 - 44 U/L 10   Edinburgh Score: Edinburgh Postnatal Depression Scale Screening Tool 07/19/2021  I have been able to laugh and see the funny side of things. 0  I have looked forward with enjoyment to things. 0  I have blamed myself unnecessarily when things went wrong. 1  I have been anxious or worried for no good reason. 2  I have felt scared or panicky for no good reason. 0  Things have been getting on top of me. 0  I have been so unhappy that I have  had difficulty sleeping. 0  I have felt sad or miserable. 0  I have been so unhappy that I have been crying. 1  The thought of harming myself has occurred to me. 0  Edinburgh Postnatal Depression Scale Total 4     After visit meds:  Allergies as of 07/21/2021       Reactions   Kiwi Extract Anaphylaxis, Other (See Comments)   Throat itchy        Medication List     TAKE these medications    furosemide 20 MG tablet Commonly known as: LASIX Take 1 tablet (20 mg total) by mouth 2 (two) times daily.   ibuprofen 600 MG tablet Commonly known as: ADVIL Take 1 tablet (600 mg total) by mouth every 6 (six) hours.   lisinopril 10 MG tablet Commonly known as: ZESTRIL Take 1 tablet (10 mg total) by mouth daily.   multivitamin-prenatal 27-0.8 MG Tabs  tablet Take 1 tablet by mouth daily at 12 noon. OTC PNV   pantoprazole 20 MG tablet Commonly known as: Protonix Take 1 tablet (20 mg total) by mouth daily.         Discharge home in stable condition Infant Feeding: Bottle Infant Disposition:home with mother Discharge instruction: per After Visit Summary and Postpartum booklet. Activity: Advance as tolerated. Pelvic rest for 6 weeks.  Diet: routine diet Future Appointments: Future Appointments  Date Time Provider [place]  07/26/2021 10:00 AM [facility] NURSE [facility] None  08/17/2021  9:15 AM Nehemiah Settle Tanna Savoy, DO [facility] None   Follow up Visit:  [facility] [place] Follow up in 1 week(s).   Specialty: Obstetrics and Gynecology Why: BP check nurse visit Contact information: [place] [id number] [phone]                 Please schedule this patient for a In person postpartum visit in 4 weeks with the following provider: MD. Additional Postpartum F/U:BP check 1 week  High risk pregnancy complicated by: HTN Delivery mode:  Vaginal, Spontaneous  Anticipated Birth Control:  Unsure   07/21/2021 Florian Buff, MD

## 2021-07-18 NOTE — Anesthesia Preprocedure Evaluation (Signed)
Anesthesia Evaluation  Patient identified by MRN, date of birth, ID band Patient awake    Reviewed: Allergy & Precautions, NPO status , Patient's Chart, lab work & pertinent test results  Airway Mallampati: II  TM Distance: >3 FB Neck ROM: Full    Dental no notable dental hx.    Pulmonary neg pulmonary ROS,    Pulmonary exam normal breath sounds clear to auscultation       Cardiovascular hypertension, negative cardio ROS Normal cardiovascular exam Rhythm:Regular Rate:Normal     Neuro/Psych negative neurological ROS  negative psych ROS   GI/Hepatic negative GI ROS, Neg liver ROS,   Endo/Other  negative endocrine ROS  Renal/GU negative Renal ROS  negative genitourinary   Musculoskeletal negative musculoskeletal ROS (+)   Abdominal (+) + obese,   Peds negative pediatric ROS (+)  Hematology negative hematology ROS (+)   Anesthesia Other Findings   Reproductive/Obstetrics (+) Pregnancy                             Anesthesia Physical Anesthesia Plan  ASA: 2  Anesthesia Plan: Epidural   Post-op Pain Management:    Induction: Intravenous  PONV Risk Score and Plan:   Airway Management Planned:   Additional Equipment:   Intra-op Plan:   Post-operative Plan:   Informed Consent: I have reviewed the patients History and Physical, chart, labs and discussed the procedure including the risks, benefits and alternatives for the proposed anesthesia with the patient or authorized representative who has indicated his/her understanding and acceptance.     Dental advisory given  Plan Discussed with: CRNA  Anesthesia Plan Comments:         Anesthesia Quick Evaluation

## 2021-07-18 NOTE — Progress Notes (Signed)
Cindy Garner is a 25 y.o. G1P0 at [redacted]w[redacted]d admitted for induction of labor due to Preeclampsia.  Subjective: Having headache. On magnesium. Epidural better  Objective: BP (!) 146/72    Pulse 90    Temp 98.1 F (36.7 C) (Axillary)    Resp 17    Ht 5\' 8"  (1.727 m)    Wt 119.6 kg    LMP 11/09/2020    SpO2 97%    BMI 40.10 kg/m  I/O last 3 completed shifts: In: 2065.5 [P.O.:10; I.V.:2055.5] Out: 950 [Urine:950] Total I/O In: 1909 [P.O.:1200; I.V.:709] Out: 1650 [Urine:1650]  FHT:  FHR: 120-130 bpm, variability: mostly moderate with periods of minimal,  accelerations:  Abscent,  decelerations:  Absent UC:   regular, every 3 minutes SVE:   Dilation: Lip/rim Effacement (%): 100 Station: 0 Exam by:: [email or web address], RN  Labs: Lab Results  Component Value Date   WBC 19.8 (H) 07/17/2021   HGB 10.0 (L) 07/17/2021   HCT 33.6 (L) 07/17/2021   MCV 83.4 07/17/2021   PLT 313 07/17/2021    Assessment / Plan: Augmentin. Slow to completion.   Labor: Progressing normally Preeclampsia:  on magnesium sulfate Fetal Wellbeing:  Category II Pain Control:  Epidural I/D:  n/a Anticipated MOD:  NSVD  07/19/2021 07/18/2021, 3:05 PM

## 2021-07-18 NOTE — Progress Notes (Signed)
Patient Vitals for the past 4 hrs:  BP Temp Temp src Pulse Resp SpO2  07/18/21 0140 134/71 -- -- 83 16 100 %  07/18/21 0135 (!) 155/99 -- -- 92 -- 100 %  07/18/21 0130 (!) 162/100 -- -- (!) 102 16 100 %  07/18/21 0125 (!) 158/102 -- -- 87 -- 100 %  07/18/21 0120 (!) 166/89 -- -- 87 -- 100 %  07/18/21 0115 (!) 166/91 -- -- 84 16 100 %  07/18/21 0110 (!) 168/100 -- -- 87 16 100 %  07/18/21 0105 (!) 159/83 -- -- 79 16 99 %  07/18/21 0100 (!) 144/78 -- -- 78 16 100 %  07/18/21 0055 (!) 149/79 -- -- 77 16 99 %  07/18/21 0050 (!) 146/82 -- -- 77 16 99 %  07/18/21 0045 (!) 149/76 -- -- 75 16 99 %  07/18/21 0040 (!) 147/70 -- -- 78 16 100 %  07/18/21 0035 (!) 154/80 -- -- 88 16 99 %  07/18/21 0033 (!) 146/76 -- -- 91 16 --  07/17/21 2330 139/79 98.2 F (36.8 C) Oral 81 16 --  07/17/21 2300 131/68 -- -- 80 16 --  07/17/21 2230 129/71 -- -- 79 16 --  07/17/21 2200 122/61 -- -- 72 -- --   Will start MgSO4, PO labetalol.  Comfortable w/epidural.  AROM w/clear fluid . FHR Cat 1. Ctx q 2-3 minutes. Pitocin at 20 mu/min,

## 2021-07-19 LAB — CBC
HCT: 28.8 % — ABNORMAL LOW (ref 36.0–46.0)
Hemoglobin: 9 g/dL — ABNORMAL LOW (ref 12.0–15.0)
MCH: 25.6 pg — ABNORMAL LOW (ref 26.0–34.0)
MCHC: 31.3 g/dL (ref 30.0–36.0)
MCV: 82.1 fL (ref 80.0–100.0)
Platelets: 295 10*3/uL (ref 150–400)
RBC: 3.51 MIL/uL — ABNORMAL LOW (ref 3.87–5.11)
RDW: 21.5 % — ABNORMAL HIGH (ref 11.5–15.5)
WBC: 21.5 10*3/uL — ABNORMAL HIGH (ref 4.0–10.5)
nRBC: 0 % (ref 0.0–0.2)

## 2021-07-19 MED ORDER — MAGNESIUM SULFATE 40 GM/1000ML IV SOLN
INTRAVENOUS | Status: AC
  Filled 2021-07-19: qty 1000

## 2021-07-19 MED ORDER — MAGNESIUM SULFATE 40 GM/1000ML IV SOLN
2.0000 g/h | INTRAVENOUS | Status: DC
  Administered 2021-07-19: 07:00:00 2 g/h via INTRAVENOUS

## 2021-07-19 MED ORDER — FUROSEMIDE 40 MG PO TABS
20.0000 mg | ORAL_TABLET | Freq: Two times a day (BID) | ORAL | Status: DC
  Administered 2021-07-19 – 2021-07-21 (×5): 20 mg via ORAL
  Filled 2021-07-19 (×5): qty 1

## 2021-07-19 NOTE — Anesthesia Postprocedure Evaluation (Signed)
Anesthesia Post Note  Patient: Cindy Garner  Procedure(s) Performed: AN AD HOC LABOR EPIDURAL     Patient location during evaluation: Mother Baby Anesthesia Type: Epidural Level of consciousness: awake and alert Pain management: pain level controlled Vital Signs Assessment: post-procedure vital signs reviewed and stable Respiratory status: spontaneous breathing, nonlabored ventilation and respiratory function stable Cardiovascular status: stable Postop Assessment: no headache, no backache and epidural receding Anesthetic complications: no   No notable events documented.  Last Vitals:  Vitals:   07/19/21 1125 07/19/21 1223  BP:    Pulse:    Resp: 17 18  Temp:    SpO2:      Last Pain:  Vitals:   07/19/21 1223  TempSrc:   PainSc: 0-No pain   Pain Goal:                   [name]

## 2021-07-19 NOTE — Progress Notes (Signed)
POSTPARTUM PROGRESS NOTE  Post Partum Day 1  Subjective:  Cindy Garner is a 25 y.o. G1P1001 s/p SVD at [redacted]w[redacted]d.  She reports she is doing well. No acute events overnight. She denies any problems with ambulating, voiding or po intake. Denies nausea or vomiting.  Pain is well controlled.  Lochia is moderate.  Pt passed one clot , approximately 3-4 cm in diameter.  Otherwise lochia is normal  Objective: Blood pressure 135/80, pulse 86, temperature 97.9 F (36.6 C), temperature source Oral, resp. rate 16, height 5\' 8"  (1.727 m), weight 119.6 kg, last menstrual period 11/09/2020, SpO2 100 %, unknown if currently breastfeeding.  Physical Exam:  General: alert, cooperative and no distress Chest: no respiratory distress Heart:regular rate, distal pulses intact Abdomen: soft, nontender,  Uterine Fundus: firm, appropriately tender DVT Evaluation: No calf swelling or tenderness Extremities: trace edema Skin: warm, dry  Recent Labs    07/17/21 2250 07/19/21 0505  HGB 10.0* 9.0*  HCT 33.6* 28.8*   Intake/Output Summary (Last 24 hours) at 07/19/2021 0715 Last data filed at 07/19/2021 07/21/2021 Gross per 24 hour  Intake 4245.11 ml  Output 4700 ml  Net -454.89 ml    4.4 L urine output  Assessment/Plan: Cindy Garner is a 25 y.o. G1P1001 s/p SVD at [redacted]w[redacted]d   PPD#1 - Doing well Continue routine postpartum care Continue magnesium sulfate until 1730 12/28 Monitor urine output Consider iron infusion prior to discharge   LOS: 2 days   1/29, MD Faculty attending 07/19/2021, 7:12 AM

## 2021-07-20 ENCOUNTER — Inpatient Hospital Stay (HOSPITAL_COMMUNITY): Admission: RE | Admit: 2021-07-20 | Payer: 59 | Source: Ambulatory Visit

## 2021-07-20 DIAGNOSIS — O1410 Severe pre-eclampsia, unspecified trimester: Secondary | ICD-10-CM | POA: Diagnosis not present

## 2021-07-20 MED ORDER — LISINOPRIL 10 MG PO TABS
10.0000 mg | ORAL_TABLET | Freq: Every day | ORAL | Status: DC
  Administered 2021-07-20: 11:00:00 10 mg via ORAL
  Filled 2021-07-20: qty 1

## 2021-07-20 NOTE — Progress Notes (Signed)
Post Partum Day 2 Subjective: no complaints, up ad lib, voiding, tolerating PO, and + flatus  Objective: Blood pressure (!) 146/69, pulse 81, temperature 98.3 F (36.8 C), temperature source Oral, resp. rate 18, height 5\' 8"  (1.727 m), weight 119.6 kg, last menstrual period 11/09/2020, SpO2 100 %, unknown if currently breastfeeding.  Physical Exam:  General: alert, cooperative, and no distress Lochia: appropriate Uterine Fundus: firm Incision:  DVT Evaluation: No evidence of DVT seen on physical exam.  Recent Labs    07/17/21 2250 07/19/21 0505  HGB 10.0* 9.0*  HCT 33.6* 28.8*   No results found for this or any previous visit (from the past 24 hour(s)).   Assessment/Plan: Plan for discharge tomorrow and Breastfeeding Added lisinopril to the lasix for BP management   LOS: 3 days   07/21/21 07/20/2021, 10:45 AM

## 2021-07-21 ENCOUNTER — Encounter: Payer: 59 | Admitting: Family Medicine

## 2021-07-21 ENCOUNTER — Other Ambulatory Visit (HOSPITAL_COMMUNITY): Payer: Self-pay

## 2021-07-21 MED ORDER — LISINOPRIL 10 MG PO TABS
10.0000 mg | ORAL_TABLET | Freq: Every day | ORAL | 1 refills | Status: AC
  Filled 2021-07-21: qty 30, 30d supply, fill #0

## 2021-07-21 MED ORDER — IBUPROFEN 600 MG PO TABS
600.0000 mg | ORAL_TABLET | Freq: Four times a day (QID) | ORAL | 0 refills | Status: AC
  Filled 2021-07-21: qty 30, 8d supply, fill #0

## 2021-07-21 MED ORDER — FUROSEMIDE 20 MG PO TABS
20.0000 mg | ORAL_TABLET | Freq: Two times a day (BID) | ORAL | 0 refills | Status: AC
  Filled 2021-07-21: qty 6, 3d supply, fill #0

## 2021-07-21 NOTE — Progress Notes (Signed)
Discharge instructions reviewed with patient. Patient verbalized understanding of medications, follow-up care, and BabyScripts enrollment.  RN verified that data from BabyScipts app transferred over to patient's chart.

## 2021-07-26 ENCOUNTER — Ambulatory Visit (INDEPENDENT_AMBULATORY_CARE_PROVIDER_SITE_OTHER): Payer: Medicaid Other

## 2021-07-26 ENCOUNTER — Other Ambulatory Visit: Payer: Self-pay

## 2021-07-26 VITALS — BP 118/64 | HR 94 | Wt 231.0 lb

## 2021-07-26 DIAGNOSIS — Z0130 Encounter for examination of blood pressure without abnormal findings: Secondary | ICD-10-CM

## 2021-07-26 NOTE — Progress Notes (Signed)
Chart reviewed - agree with CMA/RN documentation.  ° °

## 2021-07-26 NOTE — Progress Notes (Signed)
Subjective:  Cindy Garner is a 26 y.o. female here for BP check.   Hypertension ROS: taking medications as instructed, no medication side effects noted, no TIA's, no chest pain on exertion, no dyspnea on exertion, and no swelling of ankles.    Objective:  BP 118/64    Pulse 94    Wt 231 lb (104.8 kg)    LMP 11/09/2020    BMI 35.12 kg/m   Appearance alert, well appearing, and in no distress. General exam BP noted to be well controlled today in office.    Assessment:   Blood Pressure well controlled.   Plan:  Current treatment plan is effective, no change in therapy. Pt will f/u at [facility] visit.  Avyanna Spada l Anajulia Leyendecker, CMA

## 2021-07-27 ENCOUNTER — Encounter: Payer: Self-pay | Admitting: Family Medicine

## 2021-07-28 ENCOUNTER — Encounter: Payer: 59 | Admitting: Family Medicine

## 2021-07-31 ENCOUNTER — Telehealth (HOSPITAL_COMMUNITY): Payer: Self-pay | Admitting: *Deleted

## 2021-07-31 NOTE — Telephone Encounter (Signed)
Patient voiced no questions or concerns at this time. EPDS=4. Patient voiced no questions or concerns regarding infant at this time. Patient reports infant sleeps in a bassinet on his back or he co-sleeps. RN reviewed ABCs of safe sleep. Patient verbalized understanding. Patient informed about hospital's virtual postpartum classes and support groups - declined email information. Erline Levine, RN,  07/31/21, [phone]

## 2021-08-04 ENCOUNTER — Encounter: Payer: 59 | Admitting: Obstetrics and Gynecology

## 2021-08-17 ENCOUNTER — Ambulatory Visit (INDEPENDENT_AMBULATORY_CARE_PROVIDER_SITE_OTHER): Payer: Medicaid Other | Admitting: Family Medicine

## 2021-08-17 ENCOUNTER — Other Ambulatory Visit: Payer: Self-pay

## 2021-08-17 ENCOUNTER — Encounter: Payer: Self-pay | Admitting: General Practice

## 2021-08-17 ENCOUNTER — Other Ambulatory Visit (HOSPITAL_COMMUNITY)
Admission: RE | Admit: 2021-08-17 | Discharge: 2021-08-17 | Disposition: A | Discharge status: Home / Self Care | Payer: Medicaid Other | Source: Ambulatory Visit | Attending: Family Medicine | Admitting: Family Medicine

## 2021-08-17 ENCOUNTER — Encounter: Payer: Self-pay | Admitting: Family Medicine

## 2021-08-17 VITALS — BP 122/69 | HR 88 | Ht 68.0 in | Wt 223.0 lb

## 2021-08-17 DIAGNOSIS — Z30430 Encounter for insertion of intrauterine contraceptive device: Secondary | ICD-10-CM | POA: Diagnosis not present

## 2021-08-17 DIAGNOSIS — Z124 Encounter for screening for malignant neoplasm of cervix: Secondary | ICD-10-CM | POA: Diagnosis present

## 2021-08-17 MED ORDER — LEVONORGESTREL 20.1 MCG/DAY IU IUD
1.0000 | INTRAUTERINE_SYSTEM | Freq: Once | INTRAUTERINE | Status: AC
Start: 2021-08-17 — End: 2021-08-17
  Administered 2021-08-17: 1 via INTRAUTERINE

## 2021-08-17 NOTE — Progress Notes (Signed)
Post Partum Visit Note  Cindy Garner is a 26 y.o. G1P1001 female who presents for a postpartum visit. She is 4 weeks postpartum following a normal spontaneous vaginal delivery.  I have fully reviewed the prenatal and intrapartum course. The delivery was at 37.1 gestational weeks.  Anesthesia: epidural. Postpartum course has been uneventful. Baby is doing well. Baby is feeding by bottle - Gerber soothe . Bleeding no bleeding. Bowel function is normal. Bladder function is normal. Patient is not sexually active. Contraception method is IUD. Postpartum depression screening: negative.score 1   The pregnancy intention screening data noted above was reviewed. Potential methods of contraception were discussed. The patient elected to proceed with No data recorded.   Edinburgh Postnatal Depression Scale - 08/17/21 0926       Edinburgh Postnatal Depression Scale:  In the Past 7 Days   I have been able to laugh and see the funny side of things. 0    I have looked forward with enjoyment to things. 0    I have blamed myself unnecessarily when things went wrong. 0    I have been anxious or worried for no good reason. 1    I have felt scared or panicky for no good reason. 0    Things have been getting on top of me. 0    I have been so unhappy that I have had difficulty sleeping. 0    I have felt sad or miserable. 0    I have been so unhappy that I have been crying. 0    The thought of harming myself has occurred to me. 0    Edinburgh Postnatal Depression Scale Total 1             Health Maintenance Due  Topic Date Due   COVID-19 Vaccine (1) Never done   HPV VACCINES (1 - 2-dose series) Never done   PAP-Cervical Cytology Screening  Never done   PAP SMEAR-Modifier  Never done   INFLUENZA VACCINE  Never done    The following portions of the patient's history were reviewed and updated as appropriate: allergies, current medications, past family history, past medical history, past social  history, past surgical history, and problem list.  Review of Systems Pertinent items are noted in HPI.  Objective:  BP 122/69    Pulse 88    Ht 5\' 8"  (1.727 m)    Wt 223 lb (101.2 kg)    LMP 11/09/2020    Breastfeeding No    BMI 33.91 kg/m    General:  alert, cooperative, and no distress   Breasts:  not indicated  Lungs: clear to auscultation bilaterally  Heart:  regular rate and rhythm, S1, S2 normal, no murmur, click, rub or gallop  Abdomen: soft, non-tender; bowel sounds normal; no masses,  no organomegaly   Wound N/a  GU exam:  normal        IUD Procedure Note Patient identified, informed consent performed, signed copy in chart, time out was performed.  Urine pregnancy test negative.  Speculum placed [address] CThe Unity Hospital Of RochesteruRa845Le[facility] N. Levi National Arthritis HospitaJamai6LinwoodLinwoo[address] [address] BConstitution Surgery Center East LLCaRa(8022[facility] Specialty Hospital - DurhaJamaLinwood [address] ShLifecare HoLinwood [address] [facility] Surgery CenterhRa720-157-6289ornerco De Ninos YadolescentesiRa522Mount S[facility] Rehabilitation HospitaJamai66w ingle tooth tenaculum.  Uterus sounded to 8 cm.  Liletta  IUD placed per manufacturer's recommendations.  Strings trimmed to 3 cm. Tenaculum was removed, good hemostasis noted.  Patient tolerated procedure well.   Patient given post  procedure instructions and Liletta care card with expiration date.  Patient is asked to check IUD strings periodically and follow up in 4-6 weeks for IUD check.     Assessment:   1. Screening for cervical cancer   2. Postpartum exam   3. Encounter for IUD insertion      Plan:   Essential components of care per ACOG recommendations:  1.  Mood and well being: Patient with negative depression screening today. Reviewed local resources for support.  - Patient tobacco use? No.   - hx of drug use? No.    2. Infant care and feeding:  -Patient currently breastmilk feeding? No.  -Social determinants of health (SDOH) reviewed in EPIC. No concerns  3. Sexuality, contraception and birth spacing - Patient does not want a pregnancy  in the next year.  - Reviewed forms of contraception in tiered fashion. Patient desired IUD today.   - Discussed birth spacing of 18 months  4. Sleep and fatigue -Encouraged family/partner/community support of 4 hrs of uninterrupted sleep to help with mood and fatigue  5. Physical Recovery  - Discussed patients delivery and complications. She describes her labor as good. - Patient had a Vaginal, no problems at delivery. Patient had   no  laceration. Perineal healing reviewed. Patient expressed understanding - Patient has urinary incontinence? No. - Patient is safe to resume physical and sexual activity  6.  Health Maintenance - HM due items addressed Yes - Last pap smear No results found for: DIAGPAP Pap smear done at today's visit.  -Breast Cancer screening indicated? No.   7. Chronic Disease/Pregnancy Condition follow up: None  - PCP follow up  [facility] [place]

## 2021-08-21 LAB — CYTOLOGY - PAP: Diagnosis: NEGATIVE

## 2021-09-11 ENCOUNTER — Other Ambulatory Visit (HOSPITAL_COMMUNITY): Payer: Self-pay

## 2021-09-15 ENCOUNTER — Ambulatory Visit: Payer: Medicaid Other | Admitting: Family Medicine

## 2021-10-05 ENCOUNTER — Ambulatory Visit (INDEPENDENT_AMBULATORY_CARE_PROVIDER_SITE_OTHER): Payer: Medicaid Other | Admitting: Family Medicine

## 2021-10-05 ENCOUNTER — Encounter: Payer: Self-pay | Admitting: Family Medicine

## 2021-10-05 ENCOUNTER — Other Ambulatory Visit: Payer: Self-pay

## 2021-10-05 VITALS — BP 120/78 | HR 77 | Wt 213.0 lb

## 2021-10-05 DIAGNOSIS — Z30431 Encounter for routine checking of intrauterine contraceptive device: Secondary | ICD-10-CM | POA: Diagnosis not present

## 2021-10-05 NOTE — Progress Notes (Signed)
? ?  Subjective:  ? ?Patient Name: Cindy Garner, female   DOB: January 01, 1996, 26 y.o.  MRN: [id number] ? ?HPI ?Patient here for an IUD check.  She had the Liletta IUD placed 2 months ago.  She reports a couple of heavy periods. ? ? ?Review of Systems  ?Constitutional: Negative for fever and chills.  ?Gastrointestinal: Negative for abdominal pain.  ?Genitourinary: Negative for vaginal discharge, vaginal pain, pelvic pain and dyspareunia.  ? ? ?    ?Objective:  ? Physical Exam  ?Constitutional: She appears well-developed and well-nourished.  ?HENT:  ?Head: Normocephalic and atraumatic.  ?Abdominal: Soft. There is no tenderness. There is no guarding.  ?Genitourinary: There is no rash, tenderness or lesion on the right labia. There is no rash, tenderness or lesion on the left labia. No erythema or tenderness in the vagina. No foreign body around the vagina. No signs of injury around the vagina. No vaginal discharge found.  ? ? ?Skin: Skin is warm and dry.  ?Psychiatric: She has a normal mood and affect. Her behavior is normal. Judgment and thought content normal.  ? ?    ?Assessment & Plan:  ?1. IUD check up ?IUD in place. Call if continues to have heavy periods. Pt to call with any other problems.  Recheck in 1 year. ? ?

## 2021-10-05 NOTE — Progress Notes (Signed)
Patient presents for string check. Peta Peachey RN  ?

## 2022-08-06 IMAGING — US US MFM OB DETAIL+14 WK
1 series · 13 of 28 positions shown · non-contrast
Comparison: none

[Series 1: us mfm ob detail+14 wk · 132 acquisitions, 13 frames shown]
[im 5/132]
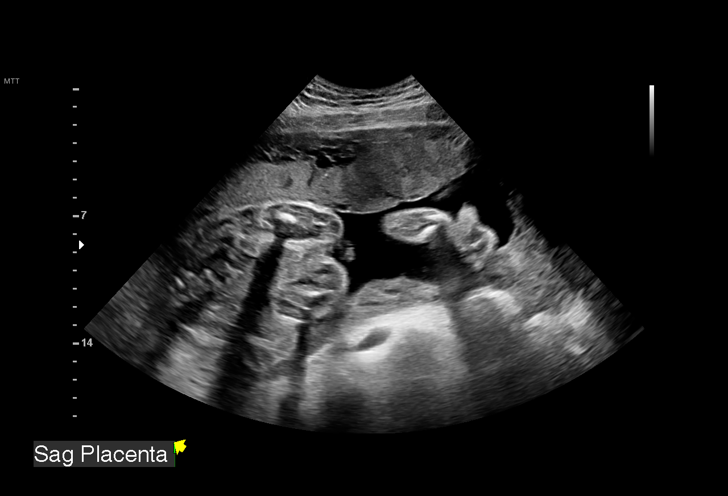
[im 15/132]
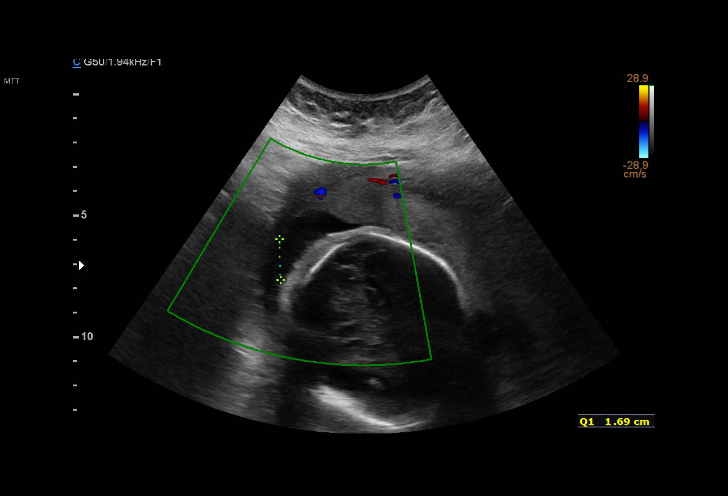
[im 25/132]
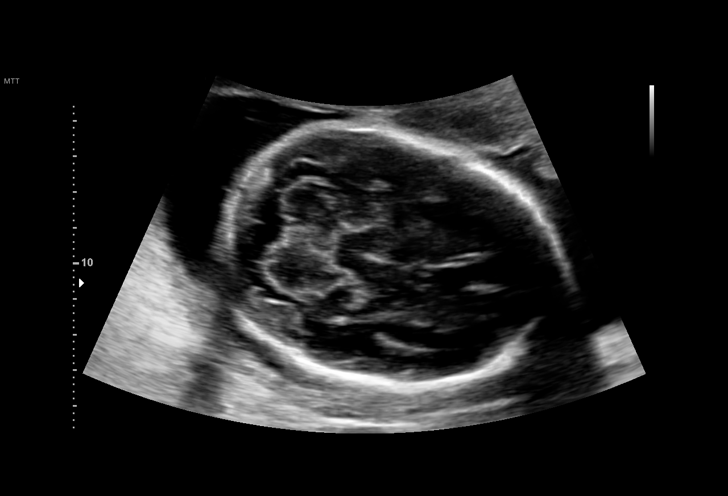
[im 34/132]
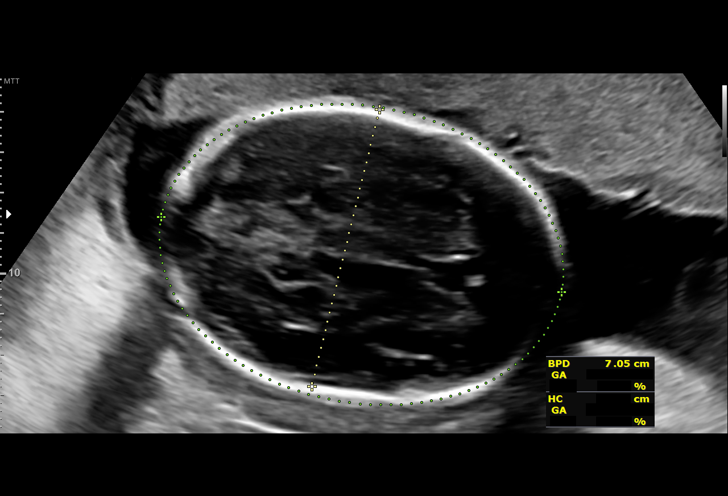
[im 44/132]
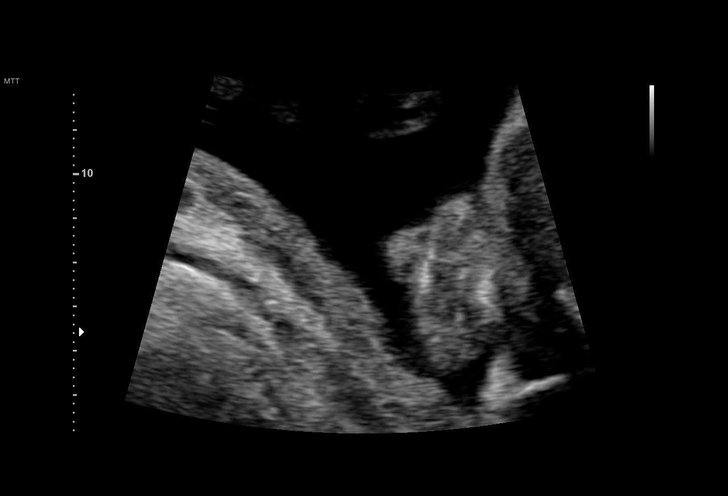
[im 54/132]
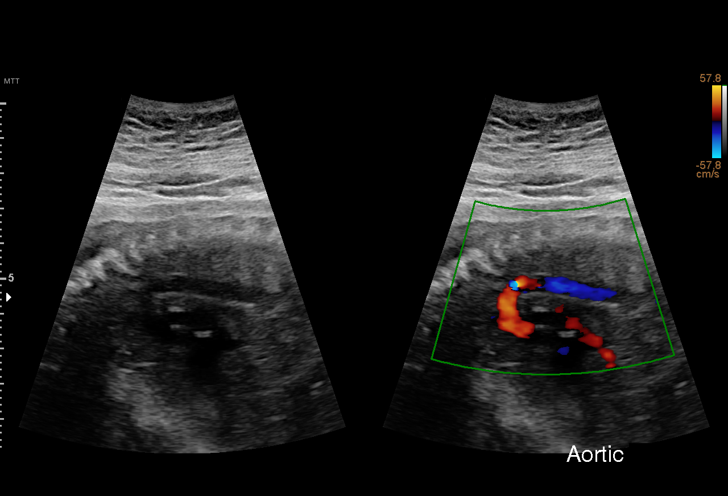
[im 68/132]
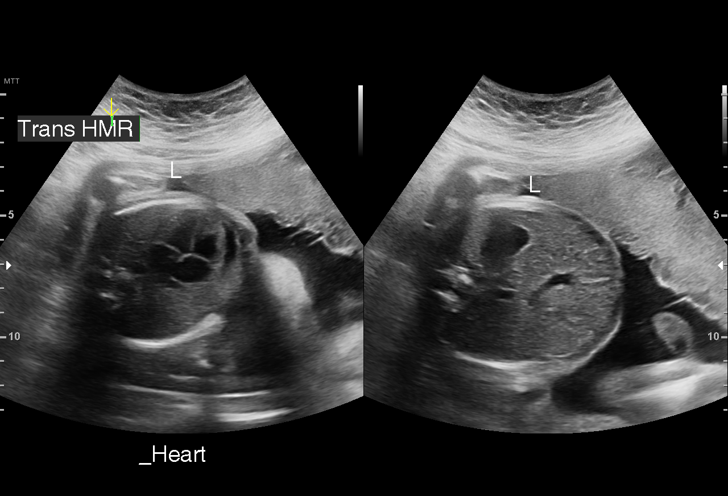
[im 78/132]
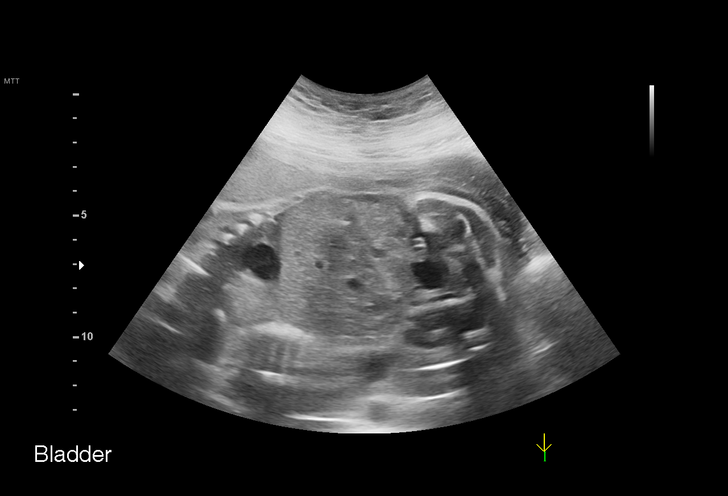
[im 88/132]
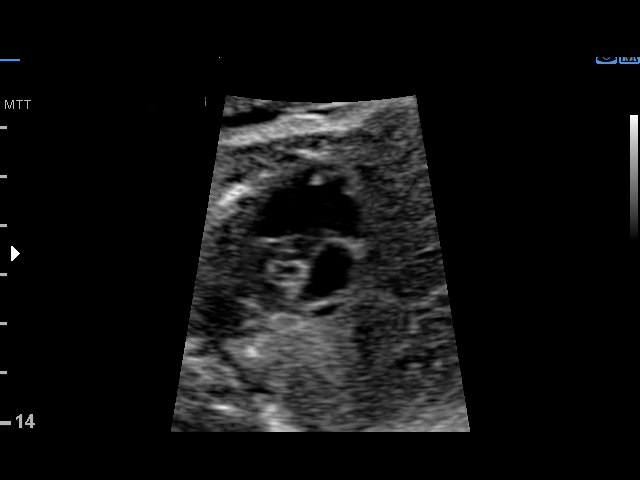
[im 98/132]
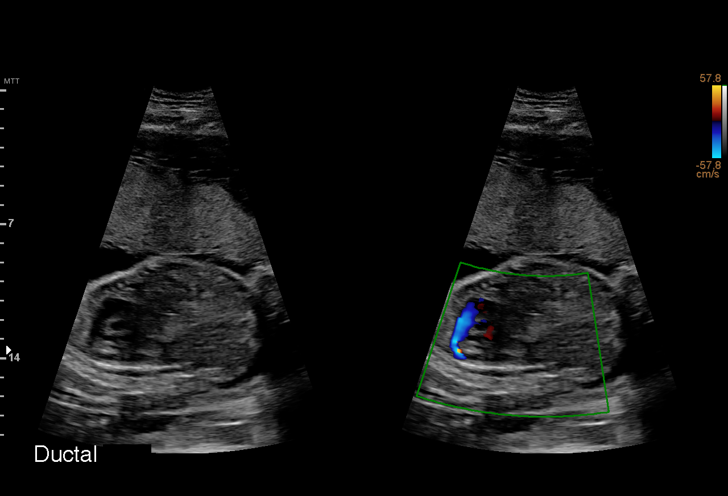
[im 107/132]
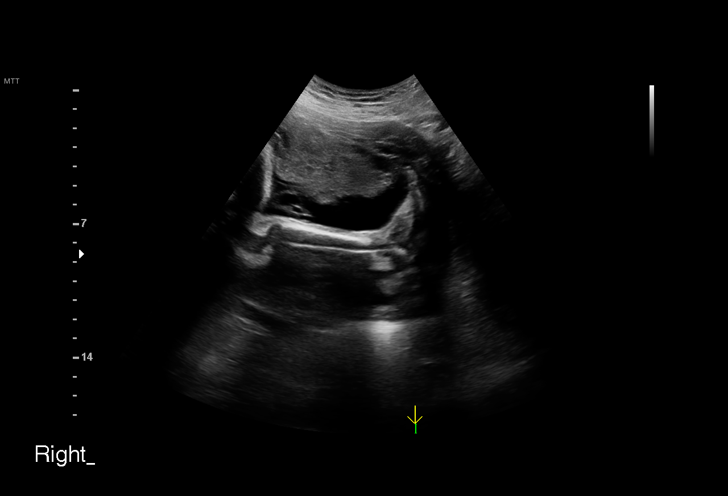
[im 117/132]
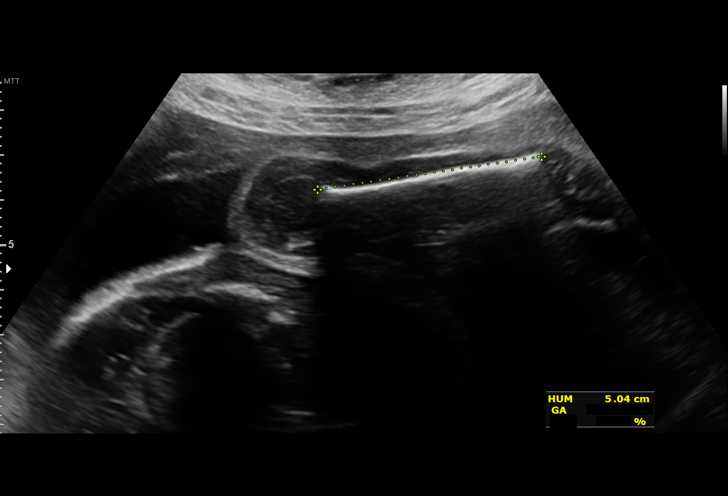
[im 127/132]
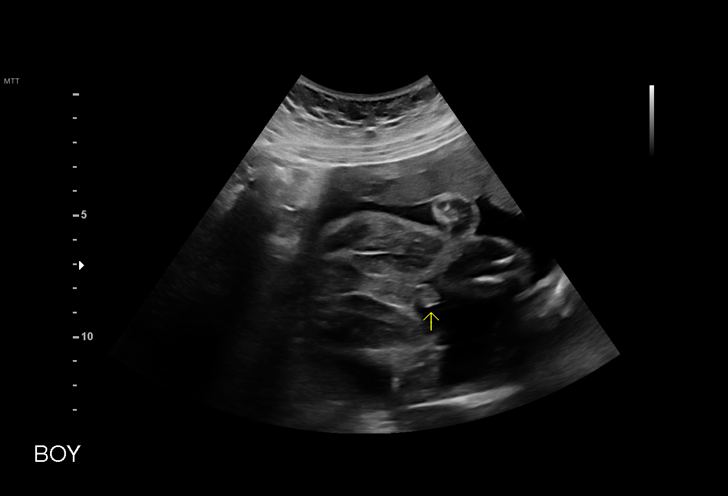

[13 of 28 positions shown; findings below may reference images not displayed]

Indications

 Insufficient Prenatal Care
 Fetal pericardial effusion
 27 weeks gestation of pregnancy
 Encounter for antenatal screening for
 malformations
 Elevated blood pressure affecting pregnancy
 in second trimester
 Maternal care for excessive fetal growth,
 second trimester, fetus unspecified
Fetal Evaluation

 Num Of Fetuses:         1
 Fetal Heart Rate(bpm):  148
 Cardiac Activity:       Observed
 Presentation:           Transverse, head to maternal right
 Placenta:               Anterior
 P. Cord Insertion:      Visualized, central

 Amniotic Fluid
 AFI FV:      Within normal limits

 AFI Sum(cm)     %Tile       Largest Pocket(cm)
 10.7            16

 RUQ(cm)       RLQ(cm)       LUQ(cm)        LLQ(cm)

Biometry
 BPD:      70.6  mm     G. Age:  28w 2d         70  %    CI:        68.52   %    70 - 86
                                                         FL/HC:      19.3   %    18.6 -
 HC:      272.6  mm     G. Age:  29w 5d         89  %    HC/AC:      1.03        1.05 -
 AC:      263.5  mm     G. Age:  30w 3d         99  %    FL/BPD:     74.4   %    71 - 87
 FL:       52.5  mm     G. Age:  28w 0d         51  %    FL/AC:      19.9   %    20 - 24
 HUM:      49.7  mm     G. Age:  29w 1d         82  %
 CER:      34.7  mm     G. Age:  29w 2d         94  %

 LV:        5.4  mm
 CM:          8  mm

 Est. FW:    8022  gm      3 lb 1 oz     97  %
OB History

 Blood Type:   O+
 Gravidity:    1         Term:   0        Prem:   0        SAB:   0
 TOP:          0       Ectopic:  0        Living: 0
Gestational Age

 LMP:           26w 1d        Date:  11/09/20                 EDD:   08/16/21
 U/S Today:     29w 1d                                        EDD:   07/26/21
 Best:          27w 3d     Det. By:  Early Ultrasound         EDD:   08/07/21
                                     (12/30/20)
Anatomy

 Cranium:               Appears normal         LVOT:                   Appears normal
 Cavum:                 Appears normal         Aortic Arch:            Appears normal
 Ventricles:            Appears normal         Ductal Arch:            Appears normal
 Choroid Plexus:        Appears normal         Diaphragm:              Appears normal
 Cerebellum:            Appears normal         Stomach:                Appears normal, left
                                                                       sided
 Posterior Fossa:       Appears normal         Abdomen:                Appears normal
 Nuchal Fold:           Not applicable (>20    Abdominal Wall:         Appears nml (cord
                        wks GA)                                        insert, abd wall)
 Face:                  Orbits nl; profile not Cord Vessels:           Appears normal (3
                        well visualized                                vessel cord)
 Lips:                  Appears normal         Kidneys:                Appear normal
 Palate:                Not well visualized    Bladder:                Appears normal
 Thoracic:              Appears normal         Spine:                  Limited views
                                                                       appear normal
 Heart:                 Pericardial            Upper Extremities:      Visualized
                        effusion
 RVOT:                  Appears normal         Lower Extremities:      Visualized

 Other:  Gender not well visualized. Nasal Bone, Lenses, 3VV and 3VTV
         visualized. Technically difficult due to advanced GA, maternal
         habitus, and fetal position.
Cervix Uterus Adnexa

 Cervix
 Length:           4.11  cm.
 Normal appearance by transabdominal scan.
 Uterus
 No abnormality visualized.

 Right Ovary
 Within normal limits.

 Left Ovary
 Within normal limits.

 Cul De Sac
 No free fluid seen.

 Adnexa
 No abnormality visualized.
Impression

 G1 P0. Late prenatal care.  Her pregnancy is well dated by 9-
 week ultrasound.  Patient has not had screening for fetal
 aneuploidies.  She does not have gestational diabetes.
 Blood pressure today at her office is 128/63 mmHg.

 We performed fetal anatomical survey.  The estimated fetal
 weight is at the 97th percentile.  Amniotic fluid is normal good
 fetal activity seen.  No obvious fetal structural defects are
 seen.  Minimal pericardial effusion measuring 3 mm is seen.

 I explained the finding that minimal pericardial effusion (up to
 7 mm) can resolve and is usually associated with good fetal
 outcomes.

 Patient understands that ultrasound has limitations in
 accurately estimating fetal weights.
Recommendations

 -An appointment was made for her to return in 4 weeks for
 fetal growth assessment.
                 Medero, Avangard

## 2022-09-09 IMAGING — US US MFM OB FOLLOW-UP
1 series · 13 of 23 positions shown · non-contrast
Comparison: none

[Series 1: us mfm ob follow-up · 23 acquisitions, 13 frames shown]
[im 1/23]
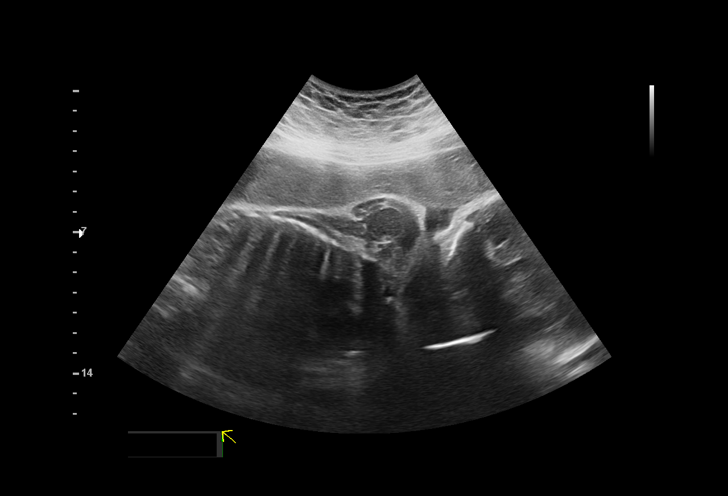
[im 3/23]
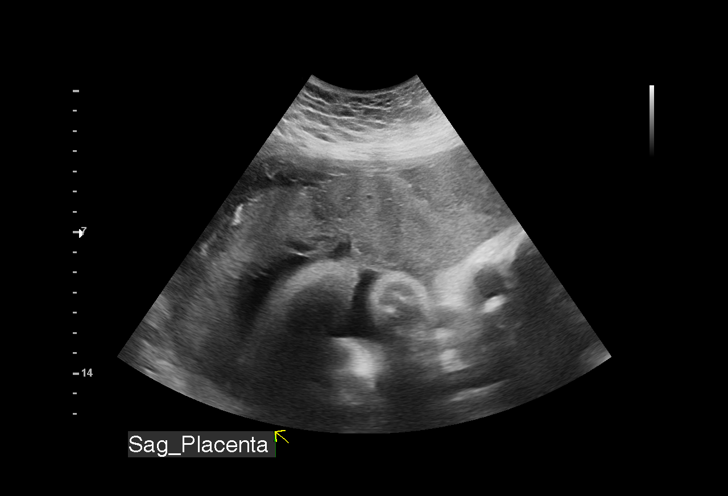
[im 5/23]
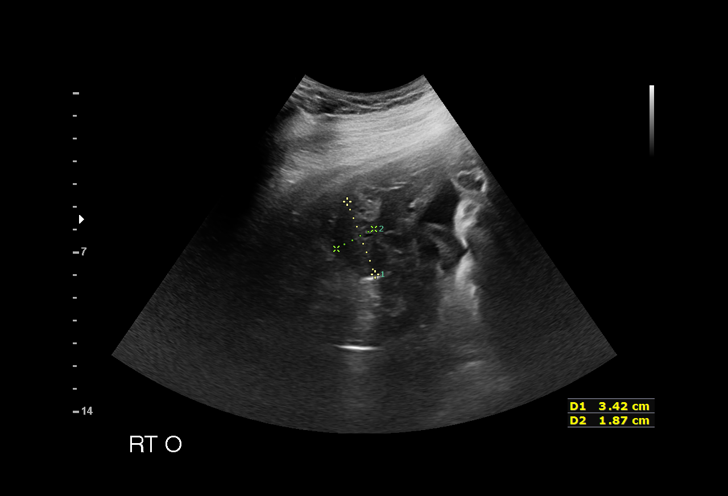
[im 7/23]
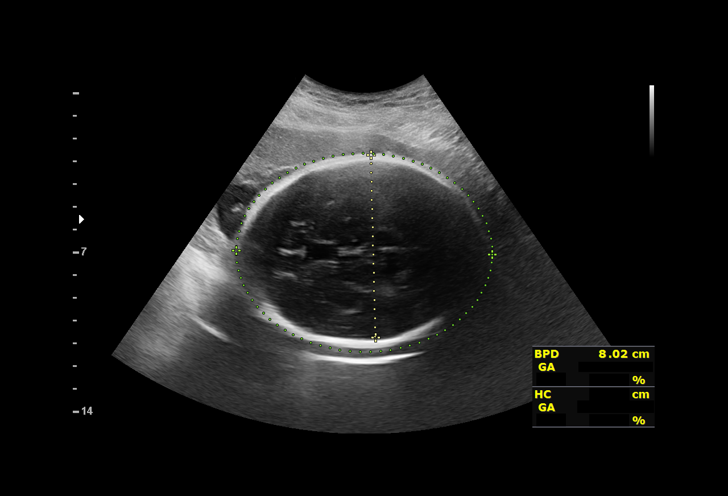
[im 8/23]
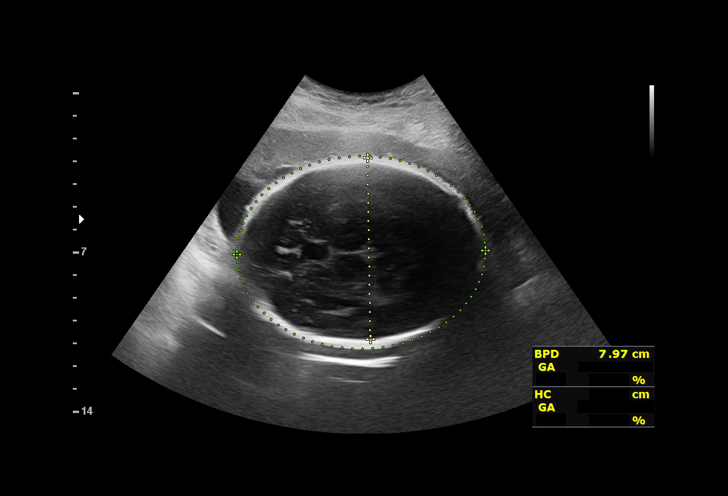
[im 10/23]
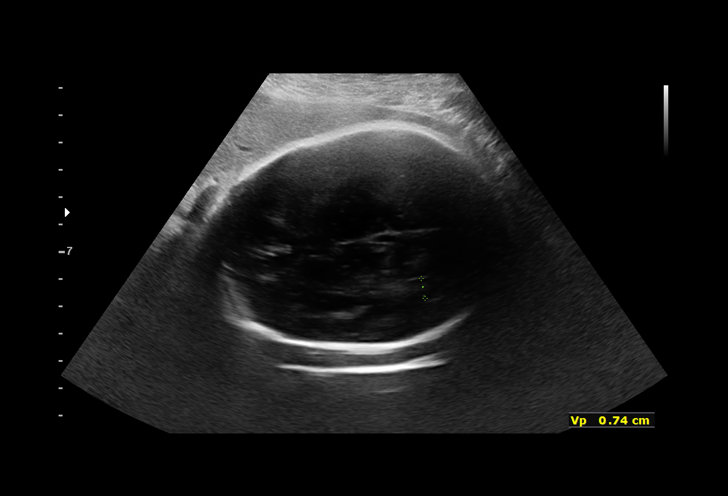
[im 12/23]
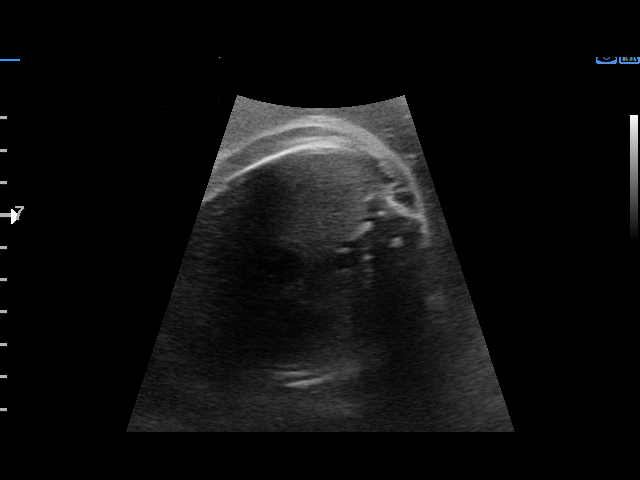
[im 14/23]
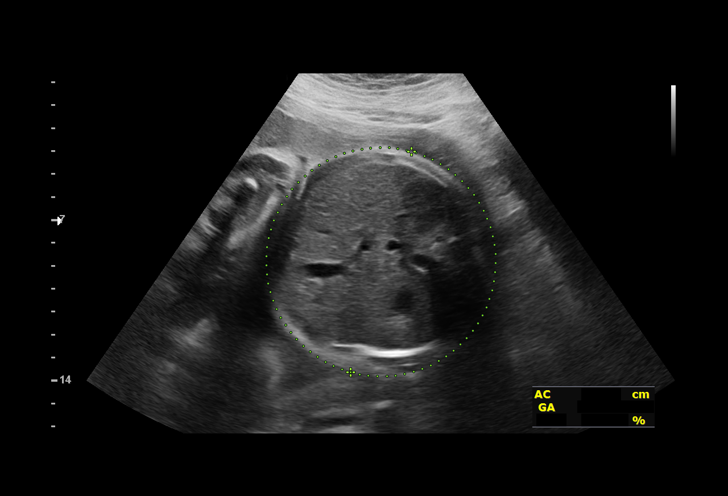
[im 16/23]
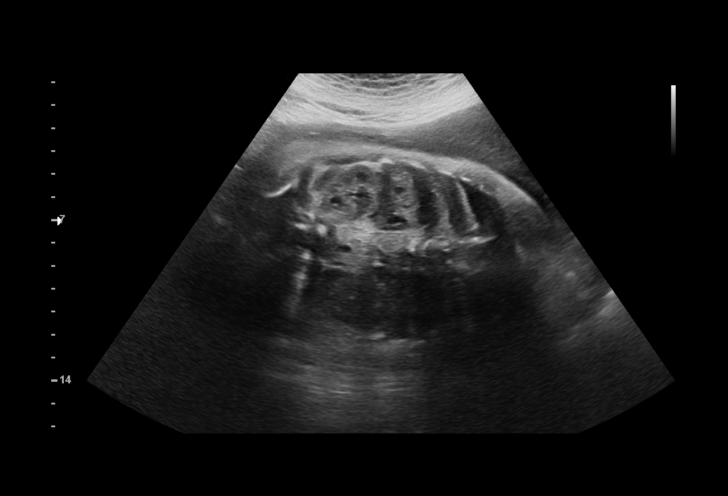
[im 17/23]
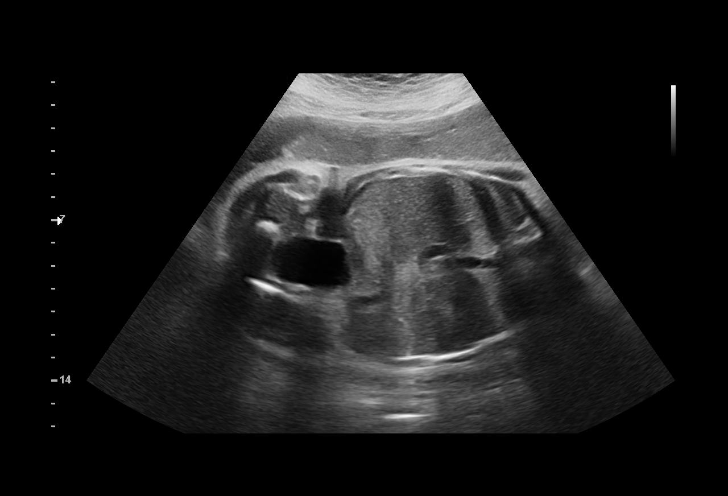
[im 19/23]
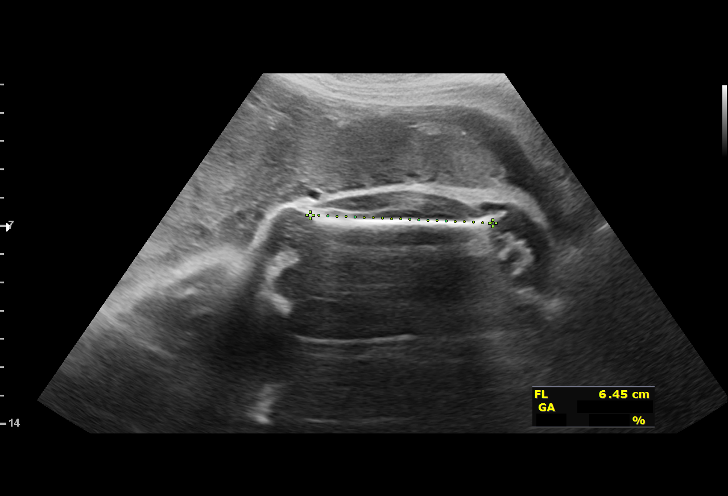
[im 21/23]
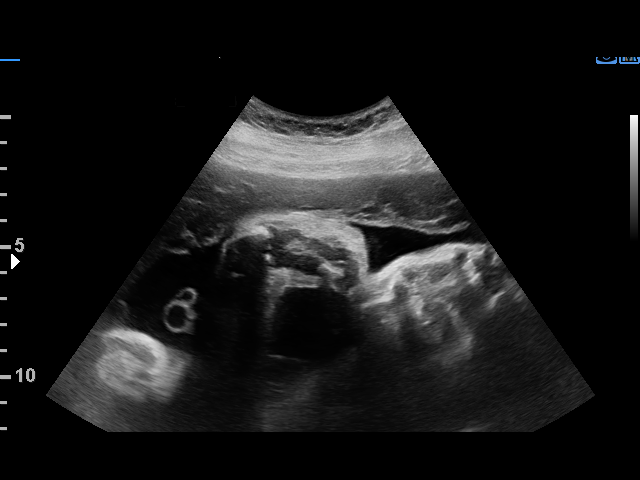
[im 23/23]
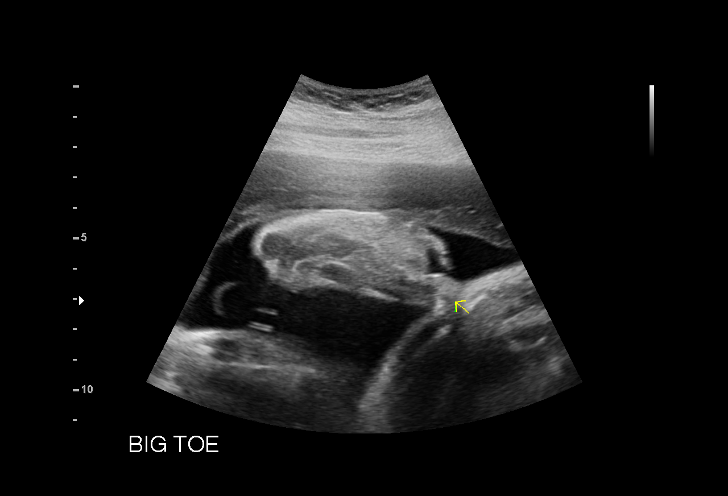

[13 of 23 positions shown; findings below may reference images not displayed]

Indications

 Insufficient Prenatal Care
 Fetal pericardial effusion
 Encounter for antenatal screening for
 malformations
 Elevated blood pressure affecting pregnancy
 in third trimester
 Maternal care for excessive fetal growth,
 third trimester, fetus unspecified
 32 weeks gestation of pregnancy
Fetal Evaluation

 Num Of Fetuses:         1
 Fetal Heart Rate(bpm):  150
 Cardiac Activity:       Observed
 Presentation:           Cephalic
 Placenta:               Anterior
 P. Cord Insertion:      Previously Visualized

 Amniotic Fluid
 AFI FV:      Within normal limits

 AFI Sum(cm)     %Tile       Largest Pocket(cm)
 16.15           58

 RUQ(cm)       RLQ(cm)       LUQ(cm)        LLQ(cm)

Biometry
 BPD:      79.9  mm     G. Age:  32w 1d         35  %    CI:        67.46   %    70 - 86
                                                         FL/HC:      20.8   %    19.1 -
 HC:      311.4  mm     G. Age:  34w 6d         80  %    HC/AC:      1.00        0.96 -
 AC:      311.3  mm     G. Age:  35w 0d         98  %    FL/BPD:     81.2   %    71 - 87
 FL:       64.9  mm     G. Age:  33w 3d         70  %    FL/AC:      20.8   %    20 - 24

 LV:        7.4  mm

 Est. FW:    5811  gm      5 lb 5 oz     94  %
OB History

 Blood Type:   O+
 Gravidity:    1         Term:   0        Prem:   0        SAB:   0
 TOP:          0       Ectopic:  0        Living: 0
Gestational Age

 LMP:           31w 0d        Date:  11/09/20                 EDD:   08/16/21
 U/S Today:     33w 6d                                        EDD:   07/27/21
 Best:          32w 2d     Det. By:  Early Ultrasound         EDD:   08/07/21
                                     (12/30/20)
Anatomy

 Cranium:               Appears normal         Aortic Arch:            Previously seen
 Cavum:                 Previously seen        Ductal Arch:            Previously seen
 Ventricles:            Appears normal         Diaphragm:              Previously seen
 Choroid Plexus:        Previously seen        Stomach:                Appears normal, left
                                                                       sided
 Cerebellum:            Previously seen        Abdomen:                Previously seen
 Posterior Fossa:       Previously seen        Abdominal Wall:         Previously seen
 Nuchal Fold:           Not applicable (>20    Cord Vessels:           Previously seen
                        wks GA)
 Face:                  Orbits nl; profile not Kidneys:                Appear normal
                        well visualized
 Lips:                  Previously seen        Bladder:                Appears normal
 Thoracic:              Previously seen        Spine:                  Limited views
                                                                       appear normal
 Heart:                 Appears normal         Upper Extremities:      Previously seen
                        (4CH, axis, and
                        situs)
 RVOT:                  Previously seen        Lower Extremities:      Previously seen
 LVOT:                  Previously seen

 Other:  Gender not well visualized. Nasal Bone, Lenses, 3VV and 3VTV
         previously visualized. Technically difficult due to advanced GA,
         maternal habitus, and fetal position.
Cervix Uterus Adnexa

 Cervix
 Not visualized (advanced GA >62wks)

 Right Ovary
 Visualized.
 Left Ovary
 Visualized.
Impression

 Follow up growth due elevated BMI and possible pericardial
 effusion.
 Normal interval growth with measurements consistent with
 dates
 Good fetal movement and amniotic fluid volume

 A pericardial effusion was not observed today.

 Ms. Auad also had elevated BP in the 276's in [DATE] she
 reports normal blood pressures since then and no s/sx of
 preeclampsia.
Recommendations

 Follow up growth as clinically indicated.
 Please consider repeat growth if blood pressure persist >
 140/90 mmHg.

## 2022-10-07 IMAGING — US US MFM OB FOLLOW-UP
1 series · 13 of 28 positions shown · non-contrast
Comparison: none

[Series 1: us mfm ob follow-up · 54 acquisitions, 13 frames shown]
[im 2/54]
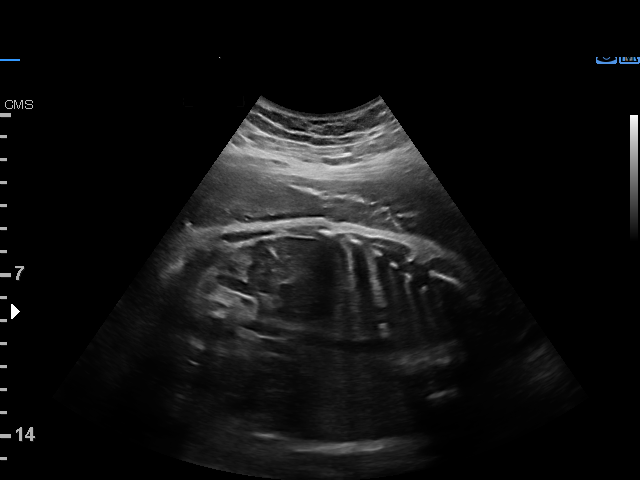
[im 6/54]
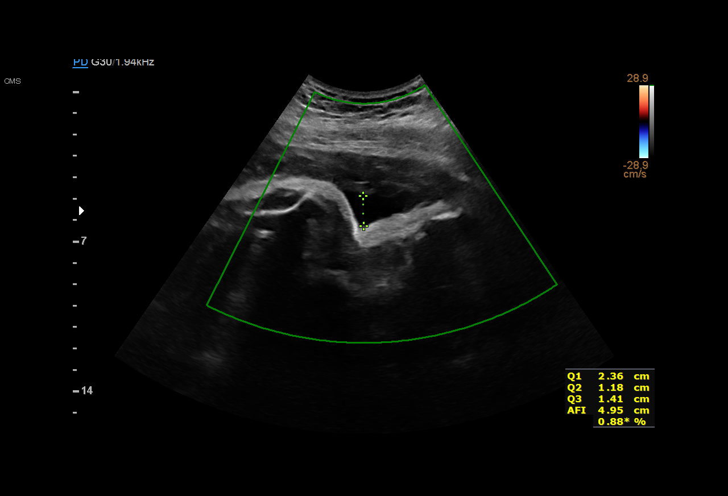
[im 10/54]
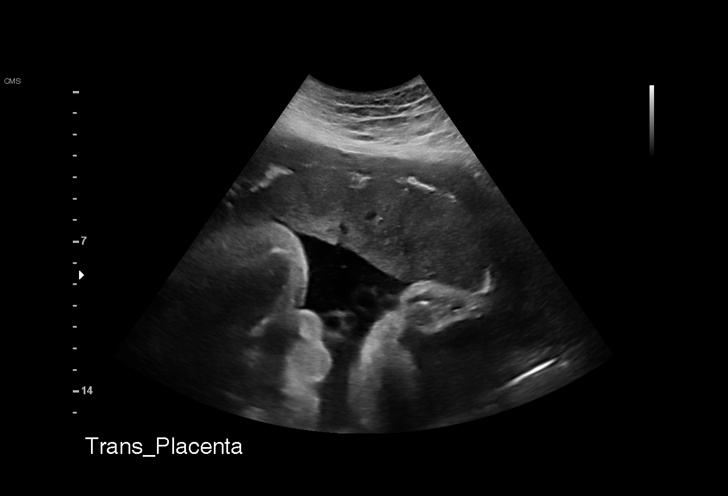
[im 14/54]
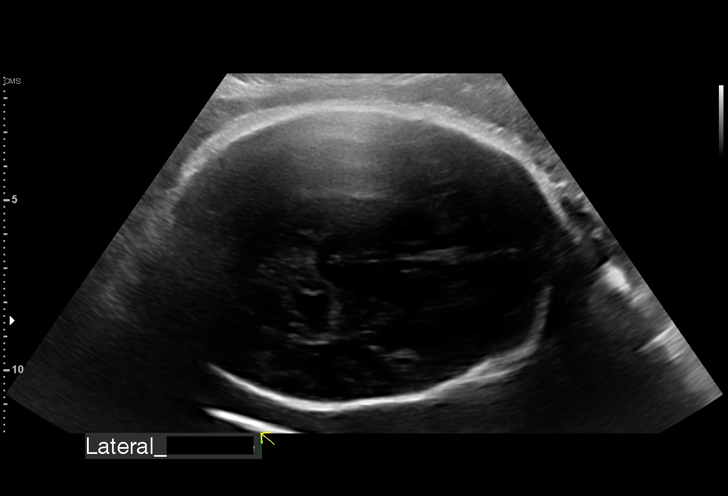
[im 18/54]
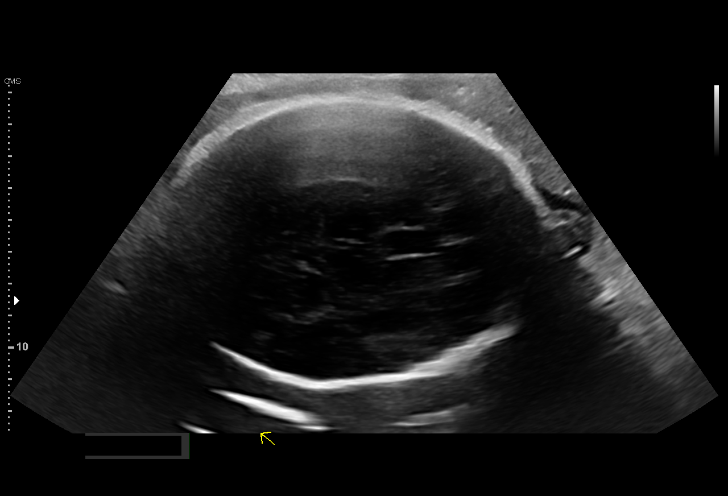
[im 22/54]
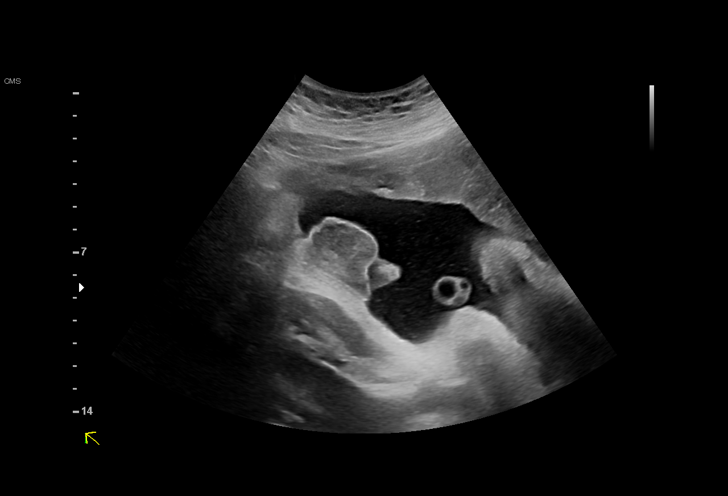
[im 28/54]
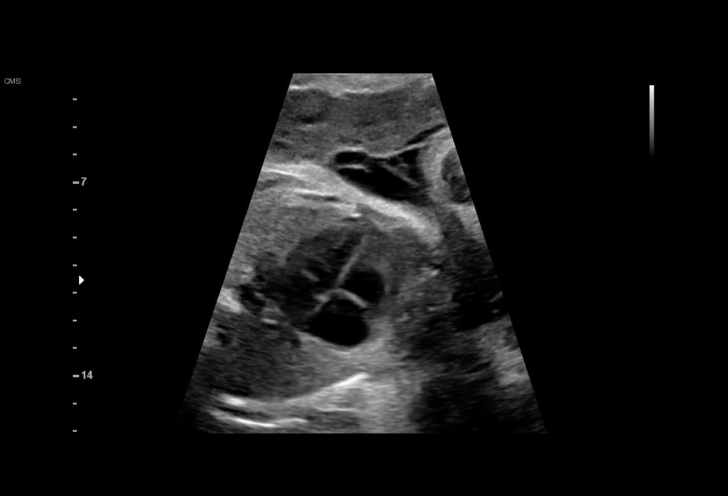
[im 32/54]
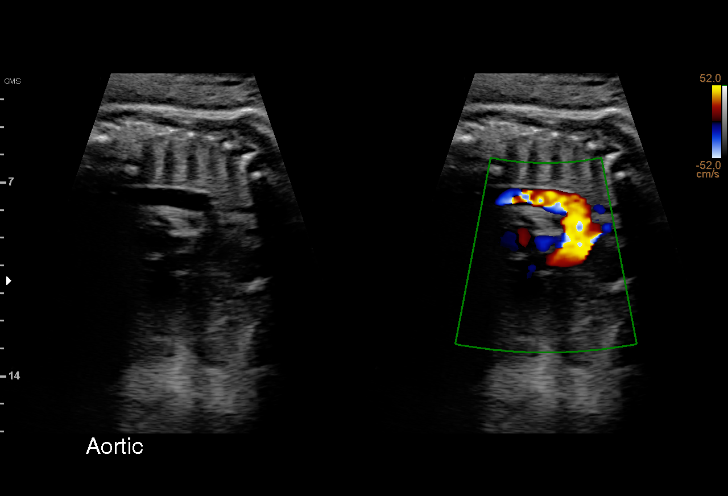
[im 36/54]
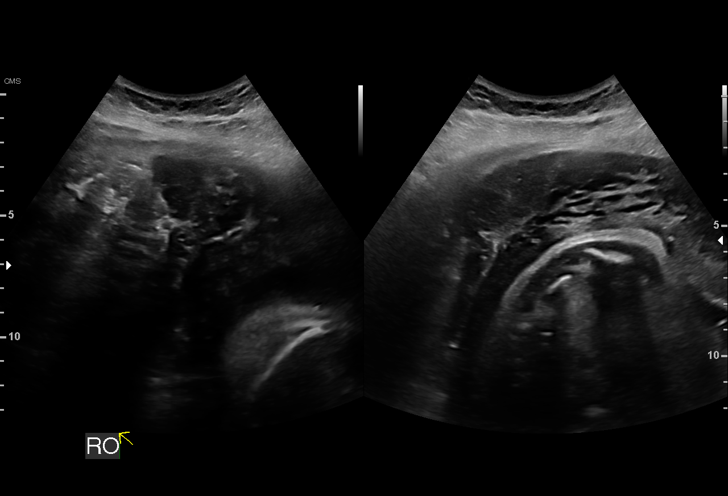
[im 40/54]
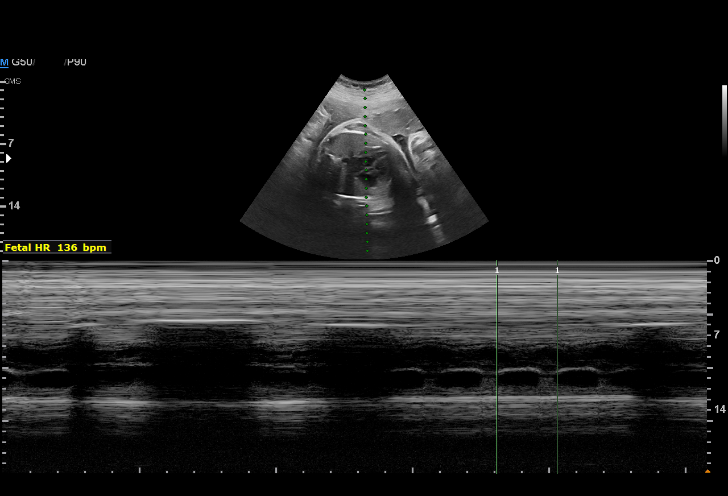
[im 44/54]
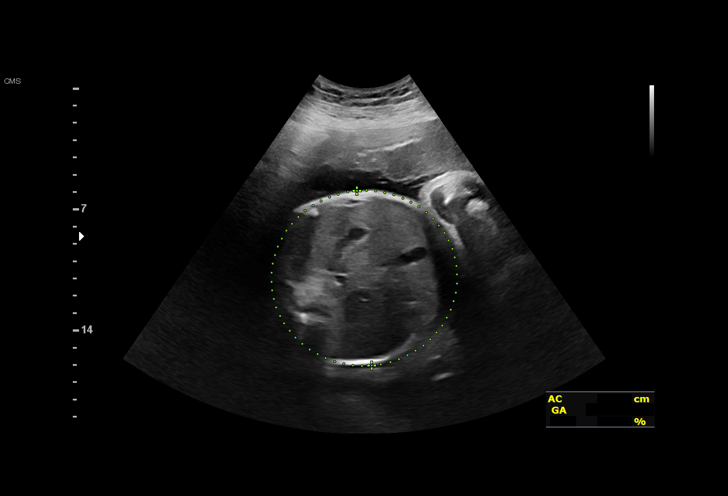
[im 48/54]
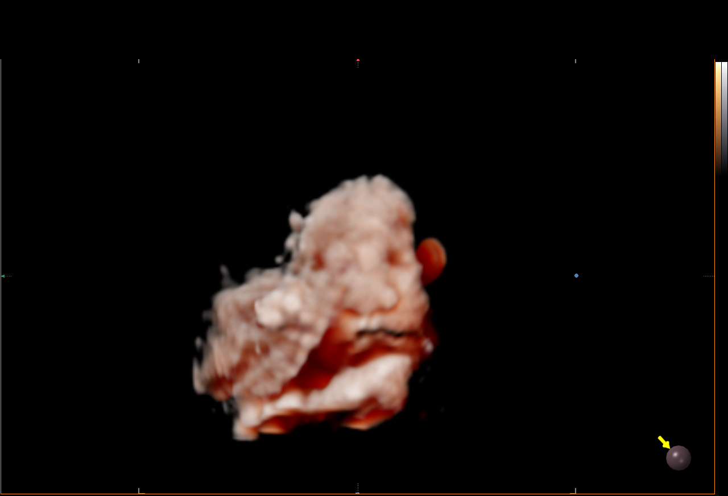
[im 52/54]
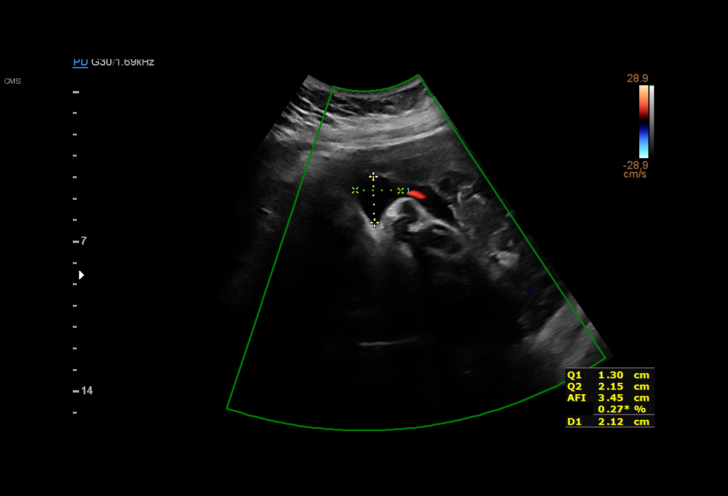

[13 of 28 positions shown; findings below may reference images not displayed]

Indications

 36 weeks gestation of pregnancy
 Insufficient Prenatal Care
 Fetal pericardial effusion
 Encounter for other antenatal screening
 follow-up
 Unspecified preeclampsia, third trimester
 Maternal care for excessive fetal growth,
 third trimester, fetus unspecified
Fetal Evaluation

 Num Of Fetuses:         1
 Fetal Heart Rate(bpm):  136
 Cardiac Activity:       Observed
 Presentation:           Cephalic
 Placenta:               Anterior
 P. Cord Insertion:      Previously Visualized

 Amniotic Fluid
 AFI FV:      Within normal limits

 AFI Sum(cm)     %Tile       Largest Pocket(cm)
 7.2             4           2

 RUQ(cm)       RLQ(cm)       LUQ(cm)        LLQ(cm)
 1.8           2
Biophysical Evaluation

 Amniotic F.V:   Pocket => 2 cm             F. Tone:        Observed
 F. Movement:    Observed                   Score:          [DATE]
 F. Breathing:   Observed
Biometry

 BPD:      87.7  mm     G. Age:  35w 3d         35  %    CI:        69.47   %    70 - 86
                                                         FL/HC:      20.7   %    20.1 -
 HC:      335.9  mm     G. Age:  38w 3d         74  %    HC/AC:      1.02        0.93 -
 AC:      329.3  mm     G. Age:  36w 6d         76  %    FL/BPD:     79.4   %    71 - 87
 FL:       69.6  mm     G. Age:  35w 5d         31  %    FL/AC:      21.1   %    20 - 24
 LV:        2.5  mm

 Est. FW:    0463  gm      6 lb 9 oz     61  %
OB History

 Blood Type:   O+
 Gravidity:    1         Term:   0        Prem:   0        SAB:   0
 TOP:          0       Ectopic:  0        Living: 0
Gestational Age

 LMP:           35w 0d        Date:  11/09/20                 EDD:   08/16/21
 U/S Today:     36w 4d                                        EDD:   08/05/21
 Best:          36w 2d     Det. By:  Early Ultrasound         EDD:   08/07/21
                                     (12/30/20)
Anatomy

 Cranium:               Appears normal         Aortic Arch:            Previously seen
 Cavum:                 Appears normal         Ductal Arch:            Previously seen
 Ventricles:            Appears normal         Diaphragm:              Previously seen
 Choroid Plexus:        Previously seen        Stomach:                Appears normal, left
                                                                       sided
 Cerebellum:            Previously seen        Abdomen:                Previously seen
 Posterior Fossa:       Previously seen        Abdominal Wall:         Previously seen
 Nuchal Fold:           Not applicable (>20    Cord Vessels:           Previously seen
                        wks GA)
 Face:                  Profile nl; orbits     Kidneys:                Appear normal
                        prev visualized
 Lips:                  Previously seen        Bladder:                Appears normal
 Thoracic:              Previously seen        Spine:                  Limited views
                                                                       appear normal
 Heart:                 Appears normal         Upper Extremities:      Previously seen
                        (4CH, axis, and
                        situs)
 RVOT:                  Previously seen        Lower Extremities:      Previously seen
 LVOT:                  Previously seen

 Other:  Gender not well visualized. Nasal Bone, Lenses, 3VV and 3VTV
         previously visualized. Technically difficult due to advanced GA,
         maternal habitus, and fetal position.
Cervix Uterus Adnexa
 Cervix
 Not visualized (advanced GA >81wks)

 Uterus
 No abnormality visualized.

 Right Ovary
 Within normal limits.

 Left Ovary
 Not visualized.

 Cul De Sac
 No free fluid seen.

 Adnexa
 No abnormality visualized.
Impression

 Gestational hypertension.
 Fetal growth is appropriate for gestational age .Amniotic fluid
 is normal and good fetal activity is seen .Antenatal testing is
 reassuring. BPP [DATE]. Cephalic presentation.

 Patient will be undergoing induction of labor on 07/17/21.
                 Iduwa, Nghoshi
